# Patient Record
Sex: Male | Born: 1965
Health system: Southern US, Community
[De-identification: ages and names within clinical notes are randomized; demographics above are authoritative.]

## PROBLEM LIST (undated history)

## (undated) DIAGNOSIS — N400 Enlarged prostate without lower urinary tract symptoms: Secondary | ICD-10-CM

## (undated) DIAGNOSIS — G4730 Sleep apnea, unspecified: Secondary | ICD-10-CM

## (undated) DIAGNOSIS — Z87442 Personal history of urinary calculi: Secondary | ICD-10-CM

## (undated) HISTORY — PX: COLONOSCOPY: SHX174

---

## 2018-03-17 ENCOUNTER — Emergency Department (HOSPITAL_BASED_OUTPATIENT_CLINIC_OR_DEPARTMENT_OTHER)
Admission: EM | Admit: 2018-03-17 | Discharge: 2018-03-17 | Disposition: A | Discharge status: Home / Self Care | Payer: BLUE CROSS/BLUE SHIELD | Attending: Emergency Medicine | Admitting: Emergency Medicine

## 2018-03-17 ENCOUNTER — Encounter (HOSPITAL_BASED_OUTPATIENT_CLINIC_OR_DEPARTMENT_OTHER): Payer: Self-pay | Admitting: Adult Health

## 2018-03-17 ENCOUNTER — Other Ambulatory Visit: Payer: Self-pay

## 2018-03-17 DIAGNOSIS — R310 Gross hematuria: Secondary | ICD-10-CM | POA: Diagnosis not present

## 2018-03-17 DIAGNOSIS — R319 Hematuria, unspecified: Secondary | ICD-10-CM | POA: Diagnosis present

## 2018-03-17 HISTORY — DX: Benign prostatic hyperplasia without lower urinary tract symptoms: N40.0

## 2018-03-17 LAB — URINALYSIS, ROUTINE W REFLEX MICROSCOPIC
Bilirubin Urine: NEGATIVE
Glucose, UA: NEGATIVE mg/dL
Ketones, ur: NEGATIVE mg/dL
Leukocytes, UA: NEGATIVE
Nitrite: NEGATIVE
Protein, ur: NEGATIVE mg/dL
Specific Gravity, Urine: <1.005 — ABNORMAL LOW (ref 1.005–1.030)
pH: 6 (ref 5.0–8.0)

## 2018-03-17 LAB — BASIC METABOLIC PANEL
Anion gap: 11 (ref 5–15)
BUN: 18 mg/dL (ref 6–20)
CO2: 25 mmol/L (ref 22–32)
Calcium: 9.2 mg/dL (ref 8.9–10.3)
Chloride: 102 mmol/L (ref 98–111)
Creatinine, Ser: 1.07 mg/dL (ref 0.61–1.24)
GFR calc Af Amer: >60 mL/min (ref >60)
GFR calc non Af Amer: >60 mL/min (ref >60)
Glucose, Bld: 89 mg/dL (ref 70–99)
Potassium: 3.6 mmol/L (ref 3.5–5.1)
Sodium: 138 mmol/L (ref 135–145)

## 2018-03-17 LAB — CBC WITH DIFFERENTIAL/PLATELET
Basophils Absolute: 0 10*3/uL (ref 0.0–0.1)
Basophils Relative: 0 %
Eosinophils Absolute: 0.2 10*3/uL (ref 0.0–0.7)
Eosinophils Relative: 3 %
HCT: 44.5 % (ref 39.0–52.0)
Hemoglobin: 15.8 g/dL (ref 13.0–17.0)
Lymphocytes Relative: 24 %
Lymphs Abs: 1.7 10*3/uL (ref 0.7–4.0)
MCH: 31.3 pg (ref 26.0–34.0)
MCHC: 35.5 g/dL (ref 30.0–36.0)
MCV: 88.3 fL (ref 78.0–100.0)
Monocytes Absolute: 0.7 10*3/uL (ref 0.1–1.0)
Monocytes Relative: 9 %
Neutro Abs: 4.7 10*3/uL (ref 1.7–7.7)
Neutrophils Relative %: 64 %
Platelets: 188 10*3/uL (ref 150–400)
RBC: 5.04 MIL/uL (ref 4.22–5.81)
RDW: 12.5 % (ref 11.5–15.5)
WBC: 7.3 10*3/uL (ref 4.0–10.5)

## 2018-03-17 LAB — URINALYSIS, MICROSCOPIC (REFLEX): RBC / HPF: >50 RBC/hpf (ref 0–5)

## 2018-03-17 NOTE — ED Triage Notes (Signed)
Presents with 2 days of hematuria and abdominal discomfort. He denies fevers. HX of enlarged prostate and kidney stones. He denies flank pain and pain but reports his abdomen feels full.

## 2018-03-17 NOTE — ED Provider Notes (Signed)
Emergency Department Provider Note   I have reviewed the triage vital signs and the nursing notes.   HISTORY  Chief Complaint Hematuria   HPI Keith Wade is a 52 y.o. male with couple days of hematuria and abdominal discomfort.  Patient states that he is urinating with normal frequency now but the patient stated that a couple days ago something was going on and he did not want to urinate so he did not take his Flomax and he held it in throughout the day.  After this is when the hematuria started.  He was seen by urgent care who sent him here for further evaluation as his sample was not able to be read secondary to too much blood.  Patient states that time he urinated here seems like it cleared up.  He has no back pain.  No groin pain radiating to the back. No other associated or modifying symptoms.    Past Medical History:  Diagnosis Date  . Enlarged prostate     There are no active problems to display for this patient.   History reviewed. No pertinent surgical history.  Current Outpatient Rx  . Order #: [id number] Class: Historical Med    Allergies Patient has no known allergies.  History reviewed. No pertinent family history.  Social History Social History   Tobacco Use  . Smoking status: Never Smoker  . Smokeless tobacco: Never Used  Substance Use Topics  . Alcohol use: Yes  . Drug use: Never    Review of Systems  All other systems negative except as documented in the HPI. All pertinent positives and negatives as reviewed in the HPI. ____________________________________________   PHYSICAL EXAM:  VITAL SIGNS: ED Triage Vitals  Enc Vitals Group     BP 03/17/18 1743 115/66     Pulse Rate 03/17/18 1743 87     Resp 03/17/18 1743 18     Temp 03/17/18 1743 98.1 F (36.7 C)     Temp Source 03/17/18 1743 Oral     SpO2 03/17/18 1743 97 %     Weight 03/17/18 1744 220 lb (99.8 kg)     Height 03/17/18 1744 5\' 10"  (1.778 m)     Head Circumference --      Peak  Flow --      Pain Score 03/17/18 1744 0     Pain Loc --      Pain Edu? --      Excl. in [place]? --     Constitutional: Alert and oriented. Well appearing and in no acute distress. Eyes: Conjunctivae are normal. PERRL. EOMI. Head: Atraumatic. Nose: No congestion/rhinnorhea. Mouth/Throat: Mucous membranes are moist.  Oropharynx non-erythematous. Neck: No stridor.  No meningeal signs.   Cardiovascular: Normal rate, regular rhythm. Good peripheral circulation. Grossly normal heart sounds.   Respiratory: Normal respiratory effort.  No retractions. Lungs CTAB. Gastrointestinal: Soft and nontender. No distention.  Musculoskeletal: No lower extremity tenderness nor edema. No gross deformities of extremities. Neurologic:  Normal speech and language. No gross focal neurologic deficits are appreciated.  Skin:  Skin is warm, dry and intact. No rash noted.   ____________________________________________   LABS (all labs ordered are listed, but only abnormal results are displayed)  Labs Reviewed  URINALYSIS, ROUTINE W REFLEX MICROSCOPIC - Abnormal; Notable for the following components:      Result Value   Color, Urine PINK (*)    Specific Gravity, Urine <1.005 (*)    Hgb urine dipstick LARGE (*)    All other  components within normal limits  URINALYSIS, MICROSCOPIC (REFLEX) - Abnormal; Notable for the following components:   Bacteria, UA RARE (*)    All other components within normal limits  URINE CULTURE  CBC WITH DIFFERENTIAL/PLATELET  BASIC METABOLIC PANEL   ____________________________________________   INITIAL IMPRESSION / ASSESSMENT AND PLAN / ED COURSE  Patient states his symptoms are not consistent with previous history of kidney stones.  No significant evidence of infection at this time.  Symptoms seem to be improving and there is no evidence of kidney injury with it suggest a postobstructive nephropathy.  Patient will continue to monitor his urine if he continues to be grossly  hematuric he will follow-up with his doctor in a few days however if he continues to improve he will follow-up in a week or 2 to make sure there is no microscopic hematuria that needs to further work-up.     Pertinent labs & imaging results that were available during my care of the patient were reviewed by me and considered in my medical decision making (see chart for details).  ____________________________________________  FINAL CLINICAL IMPRESSION(S) / ED DIAGNOSES  Final diagnoses:  Gross hematuria     MEDICATIONS GIVEN DURING THIS VISIT:  Medications - No data to display   NEW OUTPATIENT MEDICATIONS STARTED DURING THIS VISIT:  Discharge Medication List as of 03/17/2018  8:09 PM      Note:  This note was prepared with assistance of Dragon voice recognition software. Occasional wrong-word or sound-a-like substitutions may have occurred due to the inherent limitations of voice recognition software.   Merrily Pew, MD 03/17/18 2144

## 2018-03-19 LAB — URINE CULTURE: Culture: NO GROWTH

## 2018-03-24 ENCOUNTER — Other Ambulatory Visit (HOSPITAL_BASED_OUTPATIENT_CLINIC_OR_DEPARTMENT_OTHER): Payer: Self-pay | Admitting: Family Medicine

## 2018-03-24 DIAGNOSIS — R319 Hematuria, unspecified: Secondary | ICD-10-CM

## 2018-05-11 ENCOUNTER — Other Ambulatory Visit: Payer: Self-pay | Admitting: Physician Assistant

## 2018-05-11 ENCOUNTER — Other Ambulatory Visit (HOSPITAL_BASED_OUTPATIENT_CLINIC_OR_DEPARTMENT_OTHER): Payer: Self-pay | Admitting: Physician Assistant

## 2018-05-11 DIAGNOSIS — R109 Unspecified abdominal pain: Secondary | ICD-10-CM

## 2018-05-11 DIAGNOSIS — R319 Hematuria, unspecified: Secondary | ICD-10-CM

## 2020-01-11 ENCOUNTER — Ambulatory Visit: Payer: Self-pay | Admitting: General Surgery

## 2020-01-17 ENCOUNTER — Encounter (HOSPITAL_BASED_OUTPATIENT_CLINIC_OR_DEPARTMENT_OTHER): Payer: Self-pay | Admitting: General Surgery

## 2020-01-17 ENCOUNTER — Other Ambulatory Visit: Payer: Self-pay

## 2020-01-21 ENCOUNTER — Other Ambulatory Visit (HOSPITAL_COMMUNITY): Payer: Self-pay

## 2020-01-22 ENCOUNTER — Other Ambulatory Visit (HOSPITAL_COMMUNITY)
Admission: RE | Admit: 2020-01-22 | Discharge: 2020-01-22 | Disposition: A | Discharge status: Home / Self Care | Payer: 59 | Source: Ambulatory Visit | Attending: General Surgery | Admitting: General Surgery

## 2020-01-22 DIAGNOSIS — Z20822 Contact with and (suspected) exposure to covid-19: Secondary | ICD-10-CM | POA: Diagnosis not present

## 2020-01-22 DIAGNOSIS — Z01812 Encounter for preprocedural laboratory examination: Secondary | ICD-10-CM | POA: Insufficient documentation

## 2020-01-22 LAB — SARS CORONAVIRUS 2 (TAT 6-24 HRS): SARS Coronavirus 2: NEGATIVE

## 2020-01-22 NOTE — Progress Notes (Signed)

## 2020-01-24 ENCOUNTER — Ambulatory Visit (HOSPITAL_BASED_OUTPATIENT_CLINIC_OR_DEPARTMENT_OTHER): Payer: 59 | Admitting: Anesthesiology

## 2020-01-24 ENCOUNTER — Ambulatory Visit (HOSPITAL_BASED_OUTPATIENT_CLINIC_OR_DEPARTMENT_OTHER)
Admission: RE | Admit: 2020-01-24 | Discharge: 2020-01-24 | Disposition: A | Discharge status: Home / Self Care | Payer: 59 | Attending: General Surgery | Admitting: General Surgery

## 2020-01-24 ENCOUNTER — Other Ambulatory Visit: Payer: Self-pay

## 2020-01-24 ENCOUNTER — Encounter (HOSPITAL_BASED_OUTPATIENT_CLINIC_OR_DEPARTMENT_OTHER)
Admission: RE | Disposition: A | Discharge status: Home / Self Care | Payer: Self-pay | Source: Home / Self Care | Attending: General Surgery

## 2020-01-24 ENCOUNTER — Encounter (HOSPITAL_BASED_OUTPATIENT_CLINIC_OR_DEPARTMENT_OTHER): Payer: Self-pay | Admitting: General Surgery

## 2020-01-24 DIAGNOSIS — G4730 Sleep apnea, unspecified: Secondary | ICD-10-CM | POA: Insufficient documentation

## 2020-01-24 DIAGNOSIS — K4090 Unilateral inguinal hernia, without obstruction or gangrene, not specified as recurrent: Secondary | ICD-10-CM | POA: Insufficient documentation

## 2020-01-24 HISTORY — PX: INGUINAL HERNIA REPAIR: SHX194

## 2020-01-24 HISTORY — DX: Sleep apnea, unspecified: G47.30

## 2020-01-24 HISTORY — DX: Personal history of urinary calculi: Z87.442

## 2020-01-24 SURGERY — REPAIR, HERNIA, INGUINAL, ADULT
Anesthesia: Regional | Site: Groin | Laterality: Left

## 2020-01-24 MED ORDER — CELECOXIB 200 MG PO CAPS
ORAL_CAPSULE | ORAL | Status: AC
  Filled 2020-01-24: qty 1

## 2020-01-24 MED ORDER — ROCURONIUM BROMIDE 100 MG/10ML IV SOLN
INTRAVENOUS | Status: DC | PRN
  Administered 2020-01-24: 60 mg via INTRAVENOUS

## 2020-01-24 MED ORDER — ACETAMINOPHEN 160 MG/5ML PO SOLN: 1000.0000 mg | Freq: Once | ORAL | Status: DC | PRN

## 2020-01-24 MED ORDER — DEXAMETHASONE SODIUM PHOSPHATE 4 MG/ML IJ SOLN
INTRAMUSCULAR | Status: DC | PRN
  Administered 2020-01-24: 10 mg via INTRAVENOUS

## 2020-01-24 MED ORDER — LIDOCAINE HCL (CARDIAC) PF 100 MG/5ML IV SOSY
PREFILLED_SYRINGE | INTRAVENOUS | Status: DC | PRN
  Administered 2020-01-24: 50 mg via INTRAVENOUS

## 2020-01-24 MED ORDER — ACETAMINOPHEN 10 MG/ML IV SOLN: 1000.0000 mg | Freq: Once | INTRAVENOUS | Status: DC | PRN

## 2020-01-24 MED ORDER — HYDROCODONE-ACETAMINOPHEN 5-325 MG PO TABS: 1.0000 | ORAL_TABLET | Freq: Four times a day (QID) | ORAL | 0 refills | Status: DC | PRN

## 2020-01-24 MED ORDER — ACETAMINOPHEN 500 MG PO TABS
ORAL_TABLET | ORAL | Status: AC
  Filled 2020-01-24: qty 2

## 2020-01-24 MED ORDER — FENTANYL CITRATE (PF) 100 MCG/2ML IJ SOLN
INTRAMUSCULAR | Status: AC
  Filled 2020-01-24: qty 2

## 2020-01-24 MED ORDER — GABAPENTIN 300 MG PO CAPS
ORAL_CAPSULE | ORAL | Status: AC
  Filled 2020-01-24: qty 1

## 2020-01-24 MED ORDER — MIDAZOLAM HCL 5 MG/5ML IJ SOLN
INTRAMUSCULAR | Status: DC | PRN
  Administered 2020-01-24: 2 mg via INTRAVENOUS

## 2020-01-24 MED ORDER — SUGAMMADEX SODIUM 200 MG/2ML IV SOLN
INTRAVENOUS | Status: DC | PRN
Start: 2020-01-24 — End: 2020-01-24
  Administered 2020-01-24: 200 mg via INTRAVENOUS

## 2020-01-24 MED ORDER — ACETAMINOPHEN 500 MG PO TABS: 1000.0000 mg | ORAL_TABLET | Freq: Once | ORAL | Status: DC | PRN

## 2020-01-24 MED ORDER — CEFAZOLIN SODIUM-DEXTROSE 2-4 GM/100ML-% IV SOLN
INTRAVENOUS | Status: AC
  Filled 2020-01-24: qty 100

## 2020-01-24 MED ORDER — OXYCODONE HCL 5 MG PO TABS
5.0000 mg | ORAL_TABLET | Freq: Once | ORAL | Status: AC | PRN
  Administered 2020-01-24: 5 mg via ORAL

## 2020-01-24 MED ORDER — 0.9 % SODIUM CHLORIDE (POUR BTL) OPTIME
TOPICAL | Status: DC | PRN
  Administered 2020-01-24: 120 mL

## 2020-01-24 MED ORDER — GABAPENTIN 300 MG PO CAPS
300.0000 mg | ORAL_CAPSULE | ORAL | Status: AC
  Administered 2020-01-24: 300 mg via ORAL

## 2020-01-24 MED ORDER — ONDANSETRON HCL 4 MG/2ML IJ SOLN
INTRAMUSCULAR | Status: DC | PRN
  Administered 2020-01-24: 4 mg via INTRAVENOUS

## 2020-01-24 MED ORDER — MIDAZOLAM HCL 2 MG/2ML IJ SOLN
INTRAMUSCULAR | Status: AC
  Filled 2020-01-24: qty 2

## 2020-01-24 MED ORDER — OXYCODONE HCL 5 MG PO TABS
ORAL_TABLET | ORAL | Status: AC
  Filled 2020-01-24: qty 1

## 2020-01-24 MED ORDER — BUPIVACAINE HCL (PF) 0.25 % IJ SOLN
INTRAMUSCULAR | Status: DC | PRN
  Administered 2020-01-24: 18 mL

## 2020-01-24 MED ORDER — CHLORHEXIDINE GLUCONATE CLOTH 2 % EX PADS: 6.0000 | MEDICATED_PAD | Freq: Once | CUTANEOUS | Status: DC

## 2020-01-24 MED ORDER — FENTANYL CITRATE (PF) 100 MCG/2ML IJ SOLN
INTRAMUSCULAR | Status: DC | PRN
  Administered 2020-01-24: 100 ug via INTRAVENOUS

## 2020-01-24 MED ORDER — BUPIVACAINE HCL (PF) 0.25 % IJ SOLN
INTRAMUSCULAR | Status: AC
  Filled 2020-01-24: qty 30

## 2020-01-24 MED ORDER — OXYCODONE HCL 5 MG/5ML PO SOLN: 5.0000 mg | Freq: Once | ORAL | Status: AC | PRN

## 2020-01-24 MED ORDER — DIPHENHYDRAMINE HCL 50 MG/ML IJ SOLN
INTRAMUSCULAR | Status: DC | PRN
  Administered 2020-01-24: 6.2 mg via INTRAVENOUS

## 2020-01-24 MED ORDER — FENTANYL CITRATE (PF) 100 MCG/2ML IJ SOLN: 25.0000 ug | INTRAMUSCULAR | Status: DC | PRN

## 2020-01-24 MED ORDER — PROPOFOL 10 MG/ML IV BOLUS
INTRAVENOUS | Status: DC | PRN
  Administered 2020-01-24: 200 mg via INTRAVENOUS

## 2020-01-24 MED ORDER — LACTATED RINGERS IV SOLN: INTRAVENOUS | Status: DC

## 2020-01-24 MED ORDER — BUPIVACAINE-EPINEPHRINE (PF) 0.5% -1:200000 IJ SOLN
INTRAMUSCULAR | Status: DC | PRN
Start: 2020-01-24 — End: 2020-01-24
  Administered 2020-01-24: 30 mL via PERINEURAL

## 2020-01-24 MED ORDER — CEFAZOLIN SODIUM-DEXTROSE 2-4 GM/100ML-% IV SOLN
2.0000 g | INTRAVENOUS | Status: AC
  Administered 2020-01-24: 2 g via INTRAVENOUS

## 2020-01-24 MED ORDER — ACETAMINOPHEN 500 MG PO TABS: 1000.0000 mg | ORAL_TABLET | ORAL | Status: DC

## 2020-01-24 MED ORDER — CELECOXIB 200 MG PO CAPS
200.0000 mg | ORAL_CAPSULE | ORAL | Status: AC
  Administered 2020-01-24: 200 mg via ORAL

## 2020-01-24 SURGICAL SUPPLY — 46 items
ADH SKN CLS APL DERMABOND .7 (GAUZE/BANDAGES/DRESSINGS) ×1
APL PRP STRL LF DISP 70% ISPRP (MISCELLANEOUS) ×1
APL SKNCLS STERI-STRIP NONHPOA (GAUZE/BANDAGES/DRESSINGS)
BENZOIN TINCTURE PRP APPL 2/3 (GAUZE/BANDAGES/DRESSINGS) IMPLANT
BLADE CLIPPER SURG (BLADE) IMPLANT
BLADE HEX COATED 2.75 (ELECTRODE) ×2 IMPLANT
BLADE SURG 15 STRL LF DISP TIS (BLADE) ×1 IMPLANT
BLADE SURG 15 STRL SS (BLADE) ×2
CHLORAPREP W/TINT 26 (MISCELLANEOUS) ×2 IMPLANT
COVER BACK TABLE 60X90IN (DRAPES) ×2 IMPLANT
COVER MAYO STAND STRL (DRAPES) ×2 IMPLANT
COVER WAND RF STERILE (DRAPES) IMPLANT
DECANTER SPIKE VIAL GLASS SM (MISCELLANEOUS) IMPLANT
DERMABOND ADVANCED (GAUZE/BANDAGES/DRESSINGS) ×1
DERMABOND ADVANCED .7 DNX12 (GAUZE/BANDAGES/DRESSINGS) ×1 IMPLANT
DRAIN PENROSE 1/2X12 LTX STRL (WOUND CARE) ×2 IMPLANT
DRAPE LAPAROTOMY TRNSV 102X78 (DRAPES) ×2 IMPLANT
DRAPE UTILITY XL STRL (DRAPES) ×2 IMPLANT
ELECT REM PT RETURN 9FT ADLT (ELECTROSURGICAL) ×2
ELECTRODE REM PT RTRN 9FT ADLT (ELECTROSURGICAL) ×1 IMPLANT
GLOVE BIO SURGEON STRL SZ7.5 (GLOVE) ×2 IMPLANT
GOWN STRL REUS W/ TWL LRG LVL3 (GOWN DISPOSABLE) ×2 IMPLANT
GOWN STRL REUS W/TWL LRG LVL3 (GOWN DISPOSABLE) ×4
MESH ULTRAPRO 3X6 7.6X15CM (Mesh General) ×2 IMPLANT
NEEDLE HYPO 25X1 1.5 SAFETY (NEEDLE) ×2 IMPLANT
NS IRRIG 1000ML POUR BTL (IV SOLUTION) ×2 IMPLANT
PACK BASIN DAY SURGERY FS (CUSTOM PROCEDURE TRAY) ×2 IMPLANT
PENCIL SMOKE EVACUATOR (MISCELLANEOUS) ×2 IMPLANT
SLEEVE SCD COMPRESS KNEE MED (MISCELLANEOUS) ×2 IMPLANT
SPONGE LAP 18X18 RF (DISPOSABLE) ×2 IMPLANT
STRIP CLOSURE SKIN 1/2X4 (GAUZE/BANDAGES/DRESSINGS) IMPLANT
SUT MON AB 4-0 PC3 18 (SUTURE) ×2 IMPLANT
SUT PROLENE 2 0 SH DA (SUTURE) ×4 IMPLANT
SUT SILK 2 0 SH (SUTURE) IMPLANT
SUT SILK 3 0 SH 30 (SUTURE) IMPLANT
SUT SILK 3 0 TIES 17X18 (SUTURE) ×2
SUT SILK 3-0 18XBRD TIE BLK (SUTURE) ×1 IMPLANT
SUT VIC AB 0 CT1 27 (SUTURE) ×2
SUT VIC AB 0 CT1 27XBRD ANBCTR (SUTURE) ×1 IMPLANT
SUT VIC AB 0 SH 27 (SUTURE) IMPLANT
SUT VIC AB 2-0 SH 27 (SUTURE) ×2
SUT VIC AB 2-0 SH 27XBRD (SUTURE) ×1 IMPLANT
SUT VIC AB 3-0 SH 27 (SUTURE) ×2
SUT VIC AB 3-0 SH 27X BRD (SUTURE) ×1 IMPLANT
SYR CONTROL 10ML LL (SYRINGE) ×2 IMPLANT
TOWEL GREEN STERILE FF (TOWEL DISPOSABLE) ×4 IMPLANT

## 2020-01-24 NOTE — Discharge Instructions (Signed)

## 2020-01-24 NOTE — Anesthesia Procedure Notes (Signed)
Anesthesia Regional Block: TAP block   Pre-Anesthetic Checklist: ,, timeout performed, Correct Patient, Correct Site, Correct Laterality, Correct Procedure, Correct Position, site marked, Risks and benefits discussed,  Surgical consent,  Pre-op evaluation,  At surgeon's request and post-op pain management  Laterality: Left  Prep: chloraprep       Needles:  Injection technique: Single-shot     Needle Length: 9cm  Needle Gauge: 22     Additional Needles: Arrow StimuQuik ECHO Echogenic Stimulating PNB Needle  Procedures:,,,, ultrasound used (permanent image in chart),,,,  Narrative:  Start time: 01/24/2020 4:04 PM End time: 01/24/2020 4:11 PM Injection made incrementally with aspirations every 5 mL.  Performed by: Personally  Anesthesiologist: Oleta Mouse, MD

## 2020-01-24 NOTE — H&P (Signed)
Keith Wade  Location: [facility] Patient #: [id number] DOB: [birth year] Separated / Language: Keith Wade / Race: Undefined Male   History of Present Illness  The patient is a 54 year old male who presents with an inguinal hernia. we are asked to see the patient in consultation by Dr. Carolee Rota to evaluate him for a left inguinal hernia. The patient is a 54 year old male who presents with pain and a bulge in the left groin that began about 4 weeks ago. He recalls moving a large branch from his yard at the time that he developed the pain. He denies any nausea or vomiting associated with it. He is having some progressive discomfort. He does not smoke. He is otherwise in pretty good health.   Past Surgical History  No pertinent past surgical history   Allergies No Known Drug Allergies   Medication History Flomax (0.4MG  Capsule, Oral) Active. Allergy 24-HR (Oral) Specific strength unknown - Active. Advil (200MG  Tablet, Oral) Active. Medications Reconciled  Social History  Alcohol use  Occasional alcohol use. Caffeine use  Carbonated beverages, Coffee, Tea. No drug use  Tobacco use  Never smoker.  Family History  First Degree Relatives  No pertinent family history   Other Problems No pertinent past medical history     Review of Systems  General Not Present- Appetite Loss, Chills, Fatigue, Fever, Night Sweats, Weight Gain and Weight Loss. Skin Not Present- Change in Wart/Mole, Dryness, Hives, Jaundice, New Lesions, Non-Healing Wounds, Rash and Ulcer. HEENT Not Present- Earache, Hearing Loss, Hoarseness, Nose Bleed, Oral Ulcers, Ringing in the Ears, Seasonal Allergies, Sinus Pain, Sore Throat, Visual Disturbances, Wears glasses/contact lenses and Yellow Eyes. Respiratory Present- Snoring. Not Present- Bloody sputum, Chronic Cough, Difficulty Breathing and Wheezing. Breast Not Present- Breast Mass, Breast Pain, Nipple Discharge and Skin  Changes. Cardiovascular Not Present- Chest Pain, Difficulty Breathing Lying Down, Leg Cramps, Palpitations, Rapid Heart Rate, Shortness of Breath and Swelling of Extremities. Gastrointestinal Not Present- Abdominal Pain, Bloating, Bloody Stool, Change in Bowel Habits, Chronic diarrhea, Constipation, Difficulty Swallowing, Excessive gas, Gets full quickly at meals, Hemorrhoids, Indigestion, Nausea, Rectal Pain and Vomiting. Male Genitourinary Present- Frequency. Not Present- Blood in Urine, Change in Urinary Stream, Impotence, Nocturia, Painful Urination, Urgency and Urine Leakage. Musculoskeletal Not Present- Back Pain, Joint Pain, Joint Stiffness, Muscle Pain, Muscle Weakness and Swelling of Extremities. Neurological Present- Headaches. Not Present- Decreased Memory, Fainting, Numbness, Seizures, Tingling, Tremor, Trouble walking and Weakness. Psychiatric Present- Depression. Not Present- Anxiety, Bipolar, Change in Sleep Pattern, Fearful and Frequent crying. Endocrine Not Present- Cold Intolerance, Excessive Hunger, Hair Changes, Heat Intolerance, Hot flashes and New Diabetes. Hematology Not Present- Blood Thinners, Easy Bruising, Excessive bleeding, Gland problems, HIV and Persistent Infections.  Vitals Weight: 204.25 lb Height: 69in Body Surface Area: 2.08 m Body Mass Index: 30.16 kg/m  Pulse: 61 (Regular)        Physical Exam  General Mental Status-Alert. General Appearance-Consistent with stated age. Hydration-Well hydrated. Voice-Normal.  Head and Neck Head-normocephalic, atraumatic with no lesions or palpable masses. Trachea-midline. Thyroid Gland Characteristics - normal size and consistency.  Eye Eyeball - Bilateral-Extraocular movements intact. Sclera/Conjunctiva - Bilateral-No scleral icterus.  Chest and Lung Exam Chest and lung exam reveals -quiet, even and easy respiratory effort with no use of accessory muscles and on auscultation,  normal breath sounds, no adventitious sounds and normal vocal resonance. Inspection Chest Wall - Normal. Back - normal.  Cardiovascular Cardiovascular examination reveals -normal heart sounds, regular rate and rhythm with no murmurs and normal pedal  pulses bilaterally.  Abdomen Inspection Inspection of the abdomen reveals - No Hernias. Skin - Scar - no surgical scars. Palpation/Percussion Palpation and Percussion of the abdomen reveal - Soft, Non Tender, No Rebound tenderness, No Rigidity (guarding) and No hepatosplenomegaly. Auscultation Auscultation of the abdomen reveals - Bowel sounds normal.  Male Genitourinary Note: there is a small to medium size bulge in the left groin that reduces easily. There is no palpable bulge or impulse with straining in the right groin. He has normal-appearing external genitalia   Neurologic Neurologic evaluation reveals -alert and oriented x 3 with no impairment of recent or remote memory. Mental Status-Normal.  Musculoskeletal Normal Exam - Left-Upper Extremity Strength Normal and Lower Extremity Strength Normal. Normal Exam - Right-Upper Extremity Strength Normal and Lower Extremity Strength Normal.  Lymphatic Head & Neck  General Head & Neck Lymphatics: Bilateral - Description - Normal. Axillary  General Axillary Region: Bilateral - Description - Normal. Tenderness - Non Tender. Femoral & Inguinal  Generalized Femoral & Inguinal Lymphatics: Bilateral - Description - Normal. Tenderness - Non Tender.    Assessment & Plan  INGUINAL HERNIA OF LEFT SIDE WITHOUT OBSTRUCTION OR GANGRENE (K40.90) Impression: the patient appears to have a small but symptomatic left inguinal hernia. Because of the risk of incarceration and strangulation and I could benefit from having this fixed. He would also like to have this done. I have discussed with him in detail the risks and benefits of the operation to fix the hernia as well as some of the  technical aspects including the use of mesh and the risk of chronic pain and he understands and wishes to proceed. This patient encounter took 45 minutes today to perform the following: take history, perform exam, review outside records, interpret imaging, counsel the patient on their diagnosis and document encounter, findings & plan in the EHR

## 2020-01-24 NOTE — Anesthesia Procedure Notes (Signed)
Procedure Name: Intubation Date/Time: 01/24/2020 4:09 PM Performed by: Willa Frater, CRNA Pre-anesthesia Checklist: Patient identified, Emergency Drugs available, Suction available and Patient being monitored Patient Re-evaluated:Patient Re-evaluated prior to induction Oxygen Delivery Method: Circle system utilized Preoxygenation: Pre-oxygenation with 100% oxygen Induction Type: IV induction Ventilation: Mask ventilation without difficulty Grade View: Grade II Tube type: Oral Number of attempts: 1 Airway Equipment and Method: Stylet and Oral airway Placement Confirmation: ETT inserted through vocal cords under direct vision,  positive ETCO2 and breath sounds checked- equal and bilateral Secured at: 23 cm Tube secured with: Tape Dental Injury: Teeth and Oropharynx as per pre-operative assessment

## 2020-01-24 NOTE — Transfer of Care (Signed)
Immediate Anesthesia Transfer of Care Note  Patient: Keith Wade  Procedure(s) Performed: LEFT INGUINAL HERNIA REPAIR WITH MESH (Left Groin)  Patient Location: PACU  Anesthesia Type:GA combined with regional for post-op pain  Level of Consciousness: awake, alert , oriented and drowsy  Airway & Oxygen Therapy: Patient Spontanous Breathing and Patient connected to face mask oxygen  Post-op Assessment: Report given to RN and Post -op Vital signs reviewed and stable  Post vital signs: Reviewed and stable  Last Vitals:  Vitals Value Taken Time  BP    Temp    Pulse    Resp    SpO2      Last Pain:  Vitals:   01/24/20 1412  TempSrc: Oral  PainSc: 0-No pain      Patients Stated Pain Goal: 3 (27/80/04 4715)  Complications: No complications documented.

## 2020-01-24 NOTE — Op Note (Signed)
01/24/2020  5:26 PM  PATIENT:  Keith Wade  54 y.o. male  PRE-OPERATIVE DIAGNOSIS:  LEFT INGUINAL HERNIA  POST-OPERATIVE DIAGNOSIS:  LEFT INGUINAL HERNIA  PROCEDURE:  Procedure(s): LEFT INGUINAL HERNIA REPAIR WITH MESH (Left)  SURGEON:  Surgeon(s) and Role:    * Jovita Kussmaul, MD - Primary  PHYSICIAN ASSISTANT:   ASSISTANTS: none   ANESTHESIA:   local and general  EBL:  minimal   BLOOD ADMINISTERED:none  DRAINS: none   LOCAL MEDICATIONS USED:  MARCAINE     SPECIMEN:  No Specimen  DISPOSITION OF SPECIMEN:  N/A  COUNTS:  YES  TOURNIQUET:  * No tourniquets in log *  DICTATION: .Dragon Dictation   After informed consent was obtained the patient was brought to the operating room and placed in the supine position on the operating table.  After adequate induction of general anesthesia the patient's abdomen and left groin were prepped with ChloraPrep, allowed to dry, and draped in usual sterile manner.  An appropriate timeout was performed.  The left groin was then infiltrated with quarter percent Marcaine.  A small incision was made from the edge of the pubic tubercle on the left towards the anterior superior iliac spine.  The incision was carried through the skin and subcutaneous tissue sharply with the electrocautery until the fascia of the external oblique was encountered.  A small bridging vein was clamped with hemostats, divided, and ligated with 3-0 silk ties.  The external oblique fascia was then opened along its fibers towards the apex of the external ring with a 15 blade knife and Metzenbaum scissors.  A [place] retractor was deployed.  Blunt dissection was carried out of the cord structures until they could be surrounded between 2 fingers.  Half inch Penrose drain was placed around the cord structures for retraction purposes.  The cord was then gently skeletonized by blunt finger dissection.  There was a very thick muscular layer and what appeared to be a rather large  lipoma of the cord with a small sac associated with it.  Because of the large thick nature of the fat associated with the small sac this was all reduced back beneath the transversalis muscle without difficulty.  The floor of the canal was then repaired with interrupted 0 Vicryl stitches.  During the dissection the ilioinguinal nerve was identified and also involved with scar tissue.  It was dissected out proximally and distally, clamped, divided, and ligated with 3-0 silk ties.  Next a 3 x 6 piece of ultra Pro mesh was chosen and cut to the appropriate size.  The mesh was sewed inferiorly to the shelving edge of the inguinal ligament with a running 2-0 Prolene stitch.  Tails were cut in the mesh laterally and the tails were wrapped around the cord structures.  Superiorly the mesh was sewed to the musculoaponeurotic strength layer of the transversalis with interrupted 2-0 Prolene vertical mattress stitches.  The tails of the mesh were anchored to the shelving edge of the inguinal ligament lateral to the cord with interrupted 2-0 Prolene stitches.  Once this was accomplished the hernia seem well repaired and the mesh was in good position.  The wound was irrigated with copious amounts of saline.  The external oblique fascia was then reapproximated with a running 2-0 Vicryl stitch.  The wound was infiltrated with more quarter percent Marcaine.  The subcutaneous fascia was then closed with a running 3-0 Vicryl stitch.  The skin was then closed with a running 4-0 Monocryl  subcuticular stitch.  Dermabond dressings were applied.  The patient tolerated the procedure well.  At the end of the case all needle sponge and instrument counts were correct.  The patient was then awakened and taken to recovery in stable condition.  The patient's testicle was in the scrotum at the end of the case.  PLAN OF CARE: Discharge to home after PACU  PATIENT DISPOSITION:  PACU - hemodynamically stable.   Delay start of Pharmacological VTE  agent (>24hrs) due to surgical blood loss or risk of bleeding: not applicable

## 2020-01-24 NOTE — Interval H&P Note (Signed)
History and Physical Interval Note:  01/24/2020 3:28 PM  Keith Wade  has presented today for surgery, with the diagnosis of LET INGUINAL HERNIA.  The various methods of treatment have been discussed with the patient and family. After consideration of risks, benefits and other options for treatment, the patient has consented to  Procedure(s) with comments: LEFT INGUINAL HERNIA REPAIR WITH MESH (Left) - TAP BLOCK as a surgical intervention.  The patient's history has been reviewed, patient examined, no change in status, stable for surgery.  I have reviewed the patient's chart and labs.  Questions were answered to the patient's satisfaction.     Autumn Messing III

## 2020-01-24 NOTE — Anesthesia Preprocedure Evaluation (Signed)
Anesthesia Evaluation  Patient identified by MRN, date of birth, ID band Patient awake    Reviewed: Allergy & Precautions, NPO status , Patient's Chart, lab work & pertinent test results  Airway Mallampati: II  TM Distance: >3 FB Neck ROM: Full    Dental  (+) Dental Advisory Given, Teeth Intact   Pulmonary sleep apnea and Continuous Positive Airway Pressure Ventilation , neg recent URI,    breath sounds clear to auscultation       Cardiovascular negative cardio ROS   Rhythm:Regular     Neuro/Psych negative neurological ROS  negative psych ROS   GI/Hepatic negative GI ROS, Neg liver ROS,   Endo/Other  negative endocrine ROS  Renal/GU negative Renal ROS     Musculoskeletal negative musculoskeletal ROS (+)   Abdominal   Peds  Hematology negative hematology ROS (+)   Anesthesia Other Findings   Reproductive/Obstetrics                             Anesthesia Physical Anesthesia Plan  ASA: II  Anesthesia Plan: General and Regional   Post-op Pain Management:  Regional for Post-op pain   Induction: Intravenous  PONV Risk Score and Plan: 2 and Ondansetron and Dexamethasone  Airway Management Planned: LMA and Oral ETT  Additional Equipment: None  Intra-op Plan:   Post-operative Plan: Extubation in OR  Informed Consent: I have reviewed the patients History and Physical, chart, labs and discussed the procedure including the risks, benefits and alternatives for the proposed anesthesia with the patient or authorized representative who has indicated his/her understanding and acceptance.     Dental advisory given  Plan Discussed with: CRNA and Surgeon  Anesthesia Plan Comments:         Anesthesia Quick Evaluation

## 2020-01-25 ENCOUNTER — Encounter (HOSPITAL_BASED_OUTPATIENT_CLINIC_OR_DEPARTMENT_OTHER): Payer: Self-pay | Admitting: General Surgery

## 2020-02-03 NOTE — Anesthesia Postprocedure Evaluation (Signed)
Anesthesia Post Note  Patient: Karron Alvizo  Procedure(s) Performed: LEFT INGUINAL HERNIA REPAIR WITH MESH (Left Groin)     Patient location during evaluation: PACU Anesthesia Type: Regional and General Level of consciousness: awake and alert Pain management: pain level controlled Vital Signs Assessment: post-procedure vital signs reviewed and stable Respiratory status: spontaneous breathing, nonlabored ventilation, respiratory function stable and patient connected to nasal cannula oxygen Cardiovascular status: blood pressure returned to baseline and stable Postop Assessment: no apparent nausea or vomiting Anesthetic complications: no   No complications documented.  Last Vitals:  Vitals:   01/24/20 1800 01/24/20 1815  BP: 109/72 (!) 121/88  Pulse: 71 73  Resp: (!) 10 16  Temp:  36.6 C  SpO2: 99% 100%    Last Pain:  Vitals:   01/25/20 1021  TempSrc:   PainSc: 0-No pain                 Kayle Correa

## 2020-02-12 ENCOUNTER — Other Ambulatory Visit: Payer: Self-pay | Admitting: General Surgery

## 2020-02-12 DIAGNOSIS — R1011 Right upper quadrant pain: Secondary | ICD-10-CM

## 2020-02-19 ENCOUNTER — Ambulatory Visit
Admission: RE | Admit: 2020-02-19 | Discharge: 2020-02-19 | Disposition: A | Discharge status: Home / Self Care | Payer: 59 | Source: Ambulatory Visit | Attending: General Surgery | Admitting: General Surgery

## 2020-02-19 DIAGNOSIS — R1011 Right upper quadrant pain: Secondary | ICD-10-CM

## 2020-07-21 ENCOUNTER — Other Ambulatory Visit: Payer: Self-pay | Admitting: Physician Assistant

## 2020-07-21 DIAGNOSIS — R1031 Right lower quadrant pain: Secondary | ICD-10-CM

## 2020-08-05 ENCOUNTER — Ambulatory Visit
Admission: RE | Admit: 2020-08-05 | Discharge: 2020-08-05 | Disposition: A | Discharge status: Home / Self Care | Payer: 59 | Source: Ambulatory Visit | Attending: Physician Assistant | Admitting: Physician Assistant

## 2020-08-05 ENCOUNTER — Other Ambulatory Visit: Payer: Self-pay

## 2020-08-05 DIAGNOSIS — R1031 Right lower quadrant pain: Secondary | ICD-10-CM

## 2020-08-05 MED ORDER — IOPAMIDOL (ISOVUE-300) INJECTION 61%
100.0000 mL | Freq: Once | INTRAVENOUS | Status: AC | PRN
  Administered 2020-08-05: 100 mL via INTRAVENOUS

## 2020-09-05 ENCOUNTER — Ambulatory Visit: Payer: Self-pay | Admitting: General Surgery

## 2020-09-12 ENCOUNTER — Other Ambulatory Visit: Payer: Self-pay | Admitting: Urology

## 2020-10-08 NOTE — Patient Instructions (Addendum)
DUE TO COVID-19 ONLY ONE VISITOR IS ALLOWED TO COME WITH YOU AND STAY IN THE WAITING ROOM ONLY DURING PRE OP AND PROCEDURE DAY OF SURGERY. THE 1 VISITOR  MAY VISIT WITH YOU AFTER SURGERY IN YOUR PRIVATE ROOM DURING VISITING HOURS ONLY!  YOU NEED TO HAVE A COVID 19 TEST ON: 10/16/20  @ 11:00 AM, THIS TEST MUST BE DONE BEFORE SURGERY,  COVID TESTING SITE [place] [address], IT IS ON THE RIGHT GOING OUT [address] APPROXIMATELY  2 MINUTES PAST [employer] ON THE RIGHT. ONCE YOUR COVID TEST IS COMPLETED,  PLEASE BEGIN THE QUARANTINE INSTRUCTIONS AS OUTLINED IN YOUR HANDOUT.                Keith Wade   Your procedure is scheduled on: 10/20/20   Report to [facility] Main  Entrance   Report to admitting at: 11:00 AM     Call this number if you have problems the morning of surgery [phone]    Remember: Do not eat solid food :After Midnight. Clear liquids until: 10:00 am.  CLEAR LIQUID DIET  Foods Allowed                                                                     Foods Excluded  Coffee and tea, regular and decaf                             liquids that you cannot  Plain Jell-O any favor except red or purple                                           see through such as: Fruit ices (not with fruit pulp)                                     milk, soups, orange juice  Iced Popsicles                                    All solid food Carbonated beverages, regular and diet                                    Cranberry, grape and apple juices Sports drinks like Gatorade Lightly seasoned clear broth or consume(fat free) Sugar, honey syrup  Sample Menu Breakfast                                Lunch                                     Supper Cranberry juice  Beef broth                            Chicken broth Jell-O                                     Grape juice                           Apple juice Coffee or tea                         Jell-O                                      Popsicle                                                Coffee or tea                        Coffee or tea  _____________________________________________________________________  BRUSH YOUR TEETH MORNING OF SURGERY AND RINSE YOUR MOUTH OUT, NO CHEWING GUM CANDY OR MINTS.    Take these medicines the morning of surgery with A SIP OF WATER: alfuzosin.flonase as usual.Clonazepam as needed.                               You may not have any metal on your body including hair pins and              piercings  Do not wear jewelry, lotions, powders or perfumes, deodorant             Men may shave face and neck.   Do not bring valuables to the hospital. [place].  Contacts, dentures or bridgework may not be worn into surgery.  Leave suitcase in the car. After surgery it may be brought to your room.     Patients discharged the day of surgery will not be allowed to drive home. IF YOU ARE HAVING SURGERY AND GOING HOME THE SAME DAY, YOU MUST HAVE AN ADULT TO DRIVE YOU HOME AND BE WITH YOU FOR 24 HOURS. YOU MAY GO HOME BY TAXI OR UBER OR ORTHERWISE, BUT AN ADULT MUST ACCOMPANY YOU HOME AND STAY WITH YOU FOR 24 HOURS.  Name and phone number of your driver:  Special Instructions: N/A              Please read over the following fact sheets you were given: _____________________________________________________________________         [facility] - Preparing for Surgery Before surgery, you can play an important role.  Because skin is not sterile, your skin needs to be as free of germs as possible.  You can reduce the number of germs on your skin by washing with CHG (chlorahexidine gluconate) soap before surgery.  CHG is an antiseptic cleaner which kills germs and bonds with the skin to continue  killing germs even after washing. Please DO NOT use if you have an allergy to CHG or antibacterial soaps.  If  your skin becomes reddened/irritated stop using the CHG and inform your nurse when you arrive at Short Stay. Do not shave (including legs and underarms) for at least 48 hours prior to the first CHG shower.  You may shave your face/neck. Please follow these instructions carefully:  1.  Shower with CHG Soap the night before surgery and the  morning of Surgery.  2.  If you choose to wash your hair, wash your hair first as usual with your  normal  shampoo.  3.  After you shampoo, rinse your hair and body thoroughly to remove the  shampoo.                           4.  Use CHG as you would any other liquid soap.  You can apply chg directly  to the skin and wash                       Gently with a scrungie or clean washcloth.  5.  Apply the CHG Soap to your body ONLY FROM THE NECK DOWN.   Do not use on face/ open                           Wound or open sores. Avoid contact with eyes, ears mouth and genitals (private parts).                       Wash face,  Genitals (private parts) with your normal soap.             6.  Wash thoroughly, paying special attention to the area where your surgery  will be performed.  7.  Thoroughly rinse your body with warm water from the neck down.  8.  DO NOT shower/wash with your normal soap after using and rinsing off  the CHG Soap.                9.  Pat yourself dry with a clean towel.            10.  Wear clean pajamas.            11.  Place clean sheets on your bed the night of your first shower and do not  sleep with pets. Day of Surgery : Do not apply any lotions/deodorants the morning of surgery.  Please wear clean clothes to the hospital/surgery center.  FAILURE TO FOLLOW THESE INSTRUCTIONS MAY RESULT IN THE CANCELLATION OF YOUR SURGERY PATIENT SIGNATURE_________________________________  NURSE SIGNATURE__________________________________  ________________________________________________________________________

## 2020-10-09 ENCOUNTER — Other Ambulatory Visit: Payer: Self-pay

## 2020-10-09 ENCOUNTER — Encounter (HOSPITAL_COMMUNITY): Payer: Self-pay

## 2020-10-09 ENCOUNTER — Encounter (HOSPITAL_COMMUNITY)
Admission: RE | Admit: 2020-10-09 | Discharge: 2020-10-09 | Disposition: A | Discharge status: Home / Self Care | Payer: 59 | Source: Ambulatory Visit | Attending: General Surgery | Admitting: General Surgery

## 2020-10-09 DIAGNOSIS — Z01812 Encounter for preprocedural laboratory examination: Secondary | ICD-10-CM | POA: Diagnosis present

## 2020-10-09 LAB — CBC
HCT: 44.8 % (ref 39.0–52.0)
Hemoglobin: 15.5 g/dL (ref 13.0–17.0)
MCH: 31.4 pg (ref 26.0–34.0)
MCHC: 34.6 g/dL (ref 30.0–36.0)
MCV: 90.7 fL (ref 80.0–100.0)
Platelets: 182 10*3/uL (ref 150–400)
RBC: 4.94 MIL/uL (ref 4.22–5.81)
RDW: 12.5 % (ref 11.5–15.5)
WBC: 5.8 10*3/uL (ref 4.0–10.5)
nRBC: 0 % (ref 0.0–0.2)

## 2020-10-09 NOTE — Progress Notes (Signed)
COVID Vaccine Completed: Yes Date COVID Vaccine completed: 07/2020 COVID vaccine manufacturer: [place]      PCP - Constance Goltz medicine at [facility]. Cardiologist -   Chest x-ray -  EKG -  Stress Test -  ECHO -  Cardiac Cath -  Pacemaker/ICD device last checked:  Sleep Study - Yes CPAP - Yes  Fasting Blood Sugar -  Checks Blood Sugar _____ times a day  Blood Thinner Instructions: Aspirin Instructions: Last Dose:  Anesthesia review: OSA(CPAP)  Patient denies shortness of breath, fever, cough and chest pain at PAT appointment   Patient verbalized understanding of instructions that were given to them at the PAT appointment. Patient was also instructed that they will need to review over the PAT instructions again at home before surgery.

## 2020-10-16 ENCOUNTER — Other Ambulatory Visit (HOSPITAL_COMMUNITY)
Admission: RE | Admit: 2020-10-16 | Discharge: 2020-10-16 | Disposition: A | Discharge status: Home / Self Care | Payer: 59 | Source: Ambulatory Visit | Attending: General Surgery | Admitting: General Surgery

## 2020-10-16 DIAGNOSIS — Z01812 Encounter for preprocedural laboratory examination: Secondary | ICD-10-CM | POA: Insufficient documentation

## 2020-10-16 DIAGNOSIS — Z20822 Contact with and (suspected) exposure to covid-19: Secondary | ICD-10-CM | POA: Insufficient documentation

## 2020-10-16 LAB — SARS CORONAVIRUS 2 (TAT 6-24 HRS): SARS Coronavirus 2: NEGATIVE

## 2020-10-20 ENCOUNTER — Ambulatory Visit (HOSPITAL_COMMUNITY): Payer: 59 | Admitting: Anesthesiology

## 2020-10-20 ENCOUNTER — Ambulatory Visit (HOSPITAL_COMMUNITY)
Admission: RE | Admit: 2020-10-20 | Discharge: 2020-10-20 | Disposition: A | Discharge status: Home / Self Care | Payer: 59 | Attending: General Surgery | Admitting: General Surgery

## 2020-10-20 ENCOUNTER — Encounter (HOSPITAL_COMMUNITY): Payer: Self-pay | Admitting: General Surgery

## 2020-10-20 ENCOUNTER — Encounter (HOSPITAL_COMMUNITY)
Admission: RE | Disposition: A | Discharge status: Home / Self Care | Payer: Self-pay | Source: Home / Self Care | Attending: General Surgery

## 2020-10-20 DIAGNOSIS — D176 Benign lipomatous neoplasm of spermatic cord: Secondary | ICD-10-CM | POA: Diagnosis not present

## 2020-10-20 DIAGNOSIS — K4090 Unilateral inguinal hernia, without obstruction or gangrene, not specified as recurrent: Secondary | ICD-10-CM | POA: Insufficient documentation

## 2020-10-20 HISTORY — PX: LIPOMA EXCISION: SHX5283

## 2020-10-20 HISTORY — PX: INGUINAL HERNIA REPAIR: SHX194

## 2020-10-20 SURGERY — REPAIR, HERNIA, INGUINAL, ADULT
Anesthesia: General | Site: Abdomen | Laterality: Right

## 2020-10-20 MED ORDER — LACTATED RINGERS IV SOLN: INTRAVENOUS | Status: DC

## 2020-10-20 MED ORDER — OXYCODONE HCL 5 MG PO TABS
5.0000 mg | ORAL_TABLET | Freq: Once | ORAL | Status: DC | PRN
Start: 2020-10-20 — End: 2020-10-21

## 2020-10-20 MED ORDER — FENTANYL CITRATE (PF) 100 MCG/2ML IJ SOLN: 50.0000 ug | INTRAMUSCULAR | Status: DC

## 2020-10-20 MED ORDER — BUPIVACAINE-EPINEPHRINE 0.25% -1:200000 IJ SOLN
INTRAMUSCULAR | Status: DC | PRN
  Administered 2020-10-20: 16 mL

## 2020-10-20 MED ORDER — CHLORHEXIDINE GLUCONATE CLOTH 2 % EX PADS: 6.0000 | MEDICATED_PAD | Freq: Once | CUTANEOUS | Status: DC

## 2020-10-20 MED ORDER — MIDAZOLAM HCL 2 MG/2ML IJ SOLN
INTRAMUSCULAR | Status: AC
  Administered 2020-10-20: 2 mg via INTRAVENOUS
  Filled 2020-10-20: qty 2

## 2020-10-20 MED ORDER — AMISULPRIDE (ANTIEMETIC) 5 MG/2ML IV SOLN
INTRAVENOUS | Status: AC
  Filled 2020-10-20: qty 2

## 2020-10-20 MED ORDER — FENTANYL CITRATE (PF) 250 MCG/5ML IJ SOLN
INTRAMUSCULAR | Status: DC | PRN
  Administered 2020-10-20: 50 ug via INTRAVENOUS
  Administered 2020-10-20: 100 ug via INTRAVENOUS

## 2020-10-20 MED ORDER — LIDOCAINE 2% (20 MG/ML) 5 ML SYRINGE
INTRAMUSCULAR | Status: AC
  Filled 2020-10-20: qty 5

## 2020-10-20 MED ORDER — FENTANYL CITRATE (PF) 100 MCG/2ML IJ SOLN
INTRAMUSCULAR | Status: AC
  Administered 2020-10-20: 25 ug via INTRAVENOUS
  Filled 2020-10-20: qty 2

## 2020-10-20 MED ORDER — OXYCODONE HCL 5 MG/5ML PO SOLN
5.0000 mg | Freq: Once | ORAL | Status: DC | PRN
Start: 2020-10-20 — End: 2020-10-21

## 2020-10-20 MED ORDER — CEFAZOLIN SODIUM-DEXTROSE 2-4 GM/100ML-% IV SOLN
2.0000 g | INTRAVENOUS | Status: AC
  Administered 2020-10-20: 2 g via INTRAVENOUS
  Filled 2020-10-20: qty 100

## 2020-10-20 MED ORDER — GABAPENTIN 100 MG PO CAPS
100.0000 mg | ORAL_CAPSULE | Freq: Three times a day (TID) | ORAL | 1 refills | Status: DC
Start: 2020-10-20 — End: 2021-05-13

## 2020-10-20 MED ORDER — DEXAMETHASONE SODIUM PHOSPHATE 10 MG/ML IJ SOLN
INTRAMUSCULAR | Status: AC
  Filled 2020-10-20: qty 1

## 2020-10-20 MED ORDER — ACETAMINOPHEN 500 MG PO TABS
1000.0000 mg | ORAL_TABLET | ORAL | Status: AC
  Administered 2020-10-20: 1000 mg via ORAL
  Filled 2020-10-20: qty 2

## 2020-10-20 MED ORDER — AMISULPRIDE (ANTIEMETIC) 5 MG/2ML IV SOLN
5.0000 mg | Freq: Once | INTRAVENOUS | Status: AC | PRN
  Administered 2020-10-20: 5 mg via INTRAVENOUS

## 2020-10-20 MED ORDER — GABAPENTIN 300 MG PO CAPS
300.0000 mg | ORAL_CAPSULE | ORAL | Status: AC
  Administered 2020-10-20: 300 mg via ORAL
  Filled 2020-10-20: qty 1

## 2020-10-20 MED ORDER — KETAMINE HCL 10 MG/ML IJ SOLN
INTRAMUSCULAR | Status: AC
  Filled 2020-10-20: qty 1

## 2020-10-20 MED ORDER — PROPOFOL 10 MG/ML IV BOLUS
INTRAVENOUS | Status: DC | PRN
  Administered 2020-10-20: 200 mg via INTRAVENOUS

## 2020-10-20 MED ORDER — ONDANSETRON HCL 4 MG/2ML IJ SOLN
INTRAMUSCULAR | Status: AC
  Filled 2020-10-20: qty 2

## 2020-10-20 MED ORDER — FENTANYL CITRATE (PF) 100 MCG/2ML IJ SOLN
INTRAMUSCULAR | Status: AC
  Administered 2020-10-20: 100 ug via INTRAVENOUS
  Filled 2020-10-20: qty 2

## 2020-10-20 MED ORDER — CHLORHEXIDINE GLUCONATE CLOTH 2 % EX PADS
6.0000 | MEDICATED_PAD | Freq: Once | CUTANEOUS | Status: DC
Start: 2020-10-20 — End: 2020-10-21

## 2020-10-20 MED ORDER — LIDOCAINE 2% (20 MG/ML) 5 ML SYRINGE
INTRAMUSCULAR | Status: DC | PRN
  Administered 2020-10-20: 60 mg via INTRAVENOUS

## 2020-10-20 MED ORDER — 0.9 % SODIUM CHLORIDE (POUR BTL) OPTIME
TOPICAL | Status: DC | PRN
  Administered 2020-10-20: 1000 mL

## 2020-10-20 MED ORDER — MIDAZOLAM HCL 2 MG/2ML IJ SOLN: 1.0000 mg | INTRAMUSCULAR | Status: DC

## 2020-10-20 MED ORDER — ONDANSETRON HCL 4 MG/2ML IJ SOLN
4.0000 mg | Freq: Once | INTRAMUSCULAR | Status: AC | PRN
  Administered 2020-10-20: 4 mg via INTRAVENOUS

## 2020-10-20 MED ORDER — PHENYLEPHRINE 40 MCG/ML (10ML) SYRINGE FOR IV PUSH (FOR BLOOD PRESSURE SUPPORT)
PREFILLED_SYRINGE | INTRAVENOUS | Status: AC
  Filled 2020-10-20: qty 10

## 2020-10-20 MED ORDER — OXYCODONE HCL 5 MG PO TABS: 5.0000 mg | ORAL_TABLET | Freq: Four times a day (QID) | ORAL | 0 refills | Status: DC | PRN

## 2020-10-20 MED ORDER — BUPIVACAINE-EPINEPHRINE (PF) 0.25% -1:200000 IJ SOLN
INTRAMUSCULAR | Status: AC
  Filled 2020-10-20: qty 30

## 2020-10-20 MED ORDER — PROPOFOL 10 MG/ML IV BOLUS
INTRAVENOUS | Status: AC
  Filled 2020-10-20: qty 20

## 2020-10-20 MED ORDER — SUGAMMADEX SODIUM 500 MG/5ML IV SOLN
INTRAVENOUS | Status: DC | PRN
  Administered 2020-10-20: 400 mg via INTRAVENOUS

## 2020-10-20 MED ORDER — ONDANSETRON HCL 4 MG/2ML IJ SOLN
INTRAMUSCULAR | Status: DC | PRN
  Administered 2020-10-20: 4 mg via INTRAVENOUS

## 2020-10-20 MED ORDER — ROCURONIUM BROMIDE 10 MG/ML (PF) SYRINGE
PREFILLED_SYRINGE | INTRAVENOUS | Status: DC | PRN
  Administered 2020-10-20: 10 mg via INTRAVENOUS
  Administered 2020-10-20: 20 mg via INTRAVENOUS
  Administered 2020-10-20: 60 mg via INTRAVENOUS

## 2020-10-20 MED ORDER — FENTANYL CITRATE (PF) 250 MCG/5ML IJ SOLN
INTRAMUSCULAR | Status: AC
  Filled 2020-10-20: qty 5

## 2020-10-20 MED ORDER — DEXAMETHASONE SODIUM PHOSPHATE 10 MG/ML IJ SOLN
INTRAMUSCULAR | Status: DC | PRN
  Administered 2020-10-20: 5 mg via INTRAVENOUS

## 2020-10-20 MED ORDER — MIDAZOLAM HCL 2 MG/2ML IJ SOLN
INTRAMUSCULAR | Status: AC
  Filled 2020-10-20: qty 2

## 2020-10-20 MED ORDER — MIDAZOLAM HCL 5 MG/5ML IJ SOLN
INTRAMUSCULAR | Status: DC | PRN
  Administered 2020-10-20: 2 mg via INTRAVENOUS

## 2020-10-20 MED ORDER — SUGAMMADEX SODIUM 500 MG/5ML IV SOLN
INTRAVENOUS | Status: AC
  Filled 2020-10-20: qty 5

## 2020-10-20 MED ORDER — CELECOXIB 200 MG PO CAPS
200.0000 mg | ORAL_CAPSULE | ORAL | Status: AC
  Administered 2020-10-20: 200 mg via ORAL
  Filled 2020-10-20: qty 1

## 2020-10-20 MED ORDER — ORAL CARE MOUTH RINSE
15.0000 mL | Freq: Once | OROMUCOSAL | Status: AC
  Administered 2020-10-20: 15 mL via OROMUCOSAL

## 2020-10-20 MED ORDER — KETAMINE HCL 10 MG/ML IJ SOLN
INTRAMUSCULAR | Status: DC | PRN
  Administered 2020-10-20: 35 mg via INTRAVENOUS

## 2020-10-20 MED ORDER — FENTANYL CITRATE (PF) 100 MCG/2ML IJ SOLN
25.0000 ug | INTRAMUSCULAR | Status: DC | PRN
  Administered 2020-10-20: 25 ug via INTRAVENOUS

## 2020-10-20 MED ORDER — CHLORHEXIDINE GLUCONATE 0.12 % MT SOLN: 15.0000 mL | Freq: Once | OROMUCOSAL | Status: AC

## 2020-10-20 SURGICAL SUPPLY — 41 items
ADH SKN CLS APL DERMABOND .7 (GAUZE/BANDAGES/DRESSINGS) ×1
APL SKNCLS STERI-STRIP NONHPOA (GAUZE/BANDAGES/DRESSINGS) ×1
BENZOIN TINCTURE PRP APPL 2/3 (GAUZE/BANDAGES/DRESSINGS) ×2 IMPLANT
BLADE HEX COATED 2.75 (ELECTRODE) ×2 IMPLANT
BLADE SURG 15 STRL LF DISP TIS (BLADE) ×1 IMPLANT
BLADE SURG 15 STRL SS (BLADE) ×2
COVER WAND RF STERILE (DRAPES) IMPLANT
DECANTER SPIKE VIAL GLASS SM (MISCELLANEOUS) ×2 IMPLANT
DERMABOND ADVANCED (GAUZE/BANDAGES/DRESSINGS) ×1
DERMABOND ADVANCED .7 DNX12 (GAUZE/BANDAGES/DRESSINGS) IMPLANT
DRAIN PENROSE 0.5X18 (DRAIN) ×2 IMPLANT
DRAPE LAPAROSCOPIC ABDOMINAL (DRAPES) ×2 IMPLANT
ELECT REM PT RETURN 15FT ADLT (MISCELLANEOUS) ×2 IMPLANT
GAUZE SPONGE 4X4 12PLY STRL (GAUZE/BANDAGES/DRESSINGS) ×2 IMPLANT
GLOVE SURG ENC MOIS LTX SZ7.5 (GLOVE) ×4 IMPLANT
GLOVE SURG UNDER POLY LF SZ7 (GLOVE) ×1 IMPLANT
GOWN STRL REUS W/ TWL XL LVL3 (GOWN DISPOSABLE) ×1 IMPLANT
GOWN STRL REUS W/TWL LRG LVL3 (GOWN DISPOSABLE) ×2 IMPLANT
GOWN STRL REUS W/TWL XL LVL3 (GOWN DISPOSABLE) ×3 IMPLANT
KIT BASIN OR (CUSTOM PROCEDURE TRAY) ×2 IMPLANT
KIT TURNOVER KIT A (KITS) ×2 IMPLANT
MESH ULTRAPRO 3X6 7.6X15CM (Mesh General) ×1 IMPLANT
NDL HYPO 25X1 1.5 SAFETY (NEEDLE) ×1 IMPLANT
NEEDLE HYPO 25X1 1.5 SAFETY (NEEDLE) ×2 IMPLANT
NS IRRIG 1000ML POUR BTL (IV SOLUTION) ×2 IMPLANT
PACK BASIC VI WITH GOWN DISP (CUSTOM PROCEDURE TRAY) ×2 IMPLANT
PENCIL SMOKE EVACUATOR (MISCELLANEOUS) ×1 IMPLANT
SPONGE LAP 18X18 RF (DISPOSABLE) ×2 IMPLANT
STRIP CLOSURE SKIN 1/2X4 (GAUZE/BANDAGES/DRESSINGS) ×2 IMPLANT
SUT MNCRL AB 4-0 PS2 18 (SUTURE) ×2 IMPLANT
SUT PROLENE 2 0 SH DA (SUTURE) ×6 IMPLANT
SUT SILK 2 0 SH (SUTURE) ×1 IMPLANT
SUT SILK 3 0 (SUTURE) ×2
SUT SILK 3-0 18XBRD TIE 12 (SUTURE) IMPLANT
SUT VIC AB 2-0 SH 27 (SUTURE)
SUT VIC AB 2-0 SH 27X BRD (SUTURE) ×2 IMPLANT
SUT VIC AB 3-0 SH 27 (SUTURE) ×2
SUT VIC AB 3-0 SH 27XBRD (SUTURE) ×2 IMPLANT
SYR BULB IRRIG 60ML STRL (SYRINGE) ×2 IMPLANT
SYR CONTROL 10ML LL (SYRINGE) ×2 IMPLANT
TOWEL OR 17X26 10 PK STRL BLUE (TOWEL DISPOSABLE) ×2 IMPLANT

## 2020-10-20 NOTE — H&P (Signed)
Keith Wade  Location: [facility] Patient #: [id number] DOB: September 05, 1965 [name] / Race: Undefined Male   History of Present: The patient is a 55 year old male who presents for a follow-up for Abdominal pain. The patient is a 55 year old male who is about 6 months status post left inguinal hernia repair with mesh. It took him about 6 months to finally not have any discomfort in the left groin. Over the last several weeks and months he has developed some pain and swelling in the right groin. He underwent a CT scan which did show a large lipoma of the right cord. He does report a burning sensation in the right groin which could be compression of the ilioinguinal nerve. He states that the bulge in the right groin seems to come and go at times. He denies any nausea or vomiting. The burning sensation is fairly constant unless he puts his feet up in the air.    Review of Systems  General Not Present- Appetite Loss, Chills, Fatigue, Fever, Night Sweats, Weight Gain and Weight Loss. Skin Not Present- Change in Wart/Mole, Dryness, Hives, Jaundice, New Lesions, Non-Healing Wounds, Rash and Ulcer. HEENT Not Present- Earache, Hearing Loss, Hoarseness, Nose Bleed, Oral Ulcers, Ringing in the Ears, Seasonal Allergies, Sinus Pain, Sore Throat, Visual Disturbances, Wears glasses/contact lenses and Yellow Eyes. Respiratory Present- Snoring. Not Present- Bloody sputum, Chronic Cough, Difficulty Breathing and Wheezing. Breast Not Present- Breast Mass, Breast Pain, Nipple Discharge and Skin Changes. Cardiovascular Not Present- Chest Pain, Difficulty Breathing Lying Down, Leg Cramps, Palpitations, Rapid Heart Rate, Shortness of Breath and Swelling of Extremities. Gastrointestinal Not Present- Abdominal Pain, Bloating, Bloody Stool, Change in Bowel Habits, Chronic diarrhea, Constipation, Difficulty Swallowing, Excessive gas, Gets full quickly at meals, Hemorrhoids, Indigestion,  Nausea, Rectal Pain and Vomiting. Male Genitourinary Present- Frequency. Not Present- Blood in Urine, Change in Urinary Stream, Impotence, Nocturia, Painful Urination, Urgency and Urine Leakage. Musculoskeletal Not Present- Back Pain, Joint Pain, Joint Stiffness, Muscle Pain, Muscle Weakness and Swelling of Extremities. Neurological Present- Headaches. Not Present- Decreased Memory, Fainting, Numbness, Seizures, Tingling, Tremor, Trouble walking and Weakness. Psychiatric Present- Depression. Not Present- Anxiety, Bipolar, Change in Sleep Pattern, Fearful and Frequent crying. Endocrine Not Present- Cold Intolerance, Excessive Hunger, Hair Changes, Heat Intolerance and New Diabetes. Hematology Not Present- Blood Thinners, Easy Bruising, Excessive bleeding, Gland problems, HIV and Persistent Infections.  Vitals Weight: 211.38 lb Height: 69in Body Surface Area: 2.11 m Body Mass Index: 31.21 kg/m  Temp.: 97.75F  Pulse: 107 (Regular)  P.OX: 95% (Room air) BP: 120/70(Sitting, Left Arm, Standard)       Physical Exam General Mental Status-Alert. General Appearance-Consistent with stated age. Hydration-Well hydrated. Voice-Normal.  Head and Neck Head-normocephalic, atraumatic with no lesions or palpable masses. Trachea-midline. Thyroid Gland Characteristics - normal size and consistency.  Eye Eyeball - Bilateral-Extraocular movements intact. Sclera/Conjunctiva - Bilateral-No scleral icterus.  Chest and Lung Exam Chest and lung exam reveals -quiet, even and easy respiratory effort with no use of accessory muscles and on auscultation, normal breath sounds, no adventitious sounds and normal vocal resonance. Inspection Chest Wall - Normal. Back - normal.  Cardiovascular Cardiovascular examination reveals -normal heart sounds, regular rate and rhythm with no murmurs and normal pedal pulses bilaterally.  Abdomen Inspection Inspection of the abdomen  reveals - No Hernias. Skin - Scar - no surgical scars. Palpation/Percussion Palpation and Percussion of the abdomen reveal - Soft, Non Tender, No Rebound tenderness, No Rigidity (guarding) and No hepatosplenomegaly. Auscultation Auscultation of  the abdomen reveals - Bowel sounds normal.  Male Genitourinary Note: There is a palpable bulge in the right groin that does seem to reduce somewhat. There is no palpable bulge or impulse with straining in the left groin   Neurologic Neurologic evaluation reveals -alert and oriented x 3 with no impairment of recent or remote memory. Mental Status-Normal.  Musculoskeletal Normal Exam - Left-Upper Extremity Strength Normal and Lower Extremity Strength Normal. Normal Exam - Right-Upper Extremity Strength Normal and Lower Extremity Strength Normal.  Lymphatic Head & Neck  General Head & Neck Lymphatics: Bilateral - Description - Normal. Axillary  General Axillary Region: Bilateral - Description - Normal. Tenderness - Non Tender. Femoral & Inguinal  Generalized Femoral & Inguinal Lymphatics: Bilateral - Description - Normal. Tenderness - Non Tender.    Assessment & Plan INGUINAL HERNIA OF LEFT SIDE WITHOUT OBSTRUCTION OR GANGRENE (K40.90) Impression: The patient is about 6 months status post left inguinal hernia repair with mesh. He is finally feeling pretty normal on the left side without any discomfort. Over the last few weeks he is developed some discomfort and swelling in the right groin. A recent CT scan showed a possible large lipoma of the cord. He is meeting with urology tomorrow to talk about this. It is hard to say whether he could have a small hernia associated with the lipoma. If he does then we could certainly offer him a hernia repair with mesh. I have discussed this with him in detail including the risks and benefits of the surgery as well as some of the technical aspects. He will meet with urology before deciding how he  would like to proceed. If he does not have surgery then we will plan to see him back on a when necessary basis. This patient encounter took 30 minutes today to perform the following: take history, perform exam, review outside records, interpret imaging, counsel the patient on their diagnosis and document encounter, findings & plan in the EHR

## 2020-10-20 NOTE — Anesthesia Preprocedure Evaluation (Signed)
Anesthesia Evaluation  Patient identified by MRN, date of birth, ID band Patient awake    Reviewed: Allergy & Precautions, NPO status , Patient's Chart, lab work & pertinent test results  Airway Mallampati: II  TM Distance: >3 FB Neck ROM: Full    Dental  (+) Teeth Intact, Dental Advisory Given   Pulmonary    breath sounds clear to auscultation       Cardiovascular  Rhythm:Regular     Neuro/Psych    GI/Hepatic   Endo/Other    Renal/GU      Musculoskeletal   Abdominal   Peds  Hematology   Anesthesia Other Findings   Reproductive/Obstetrics                             Anesthesia Physical Anesthesia Plan  ASA: II  Anesthesia Plan: General   Post-op Pain Management:  Regional for Post-op pain   Induction: Intravenous  PONV Risk Score and Plan: Ondansetron and Dexamethasone  Airway Management Planned: Oral ETT  Additional Equipment:   Intra-op Plan:   Post-operative Plan: Extubation in OR  Informed Consent: I have reviewed the patients History and Physical, chart, labs and discussed the procedure including the risks, benefits and alternatives for the proposed anesthesia with the patient or authorized representative who has indicated his/her understanding and acceptance.     Dental advisory given  Plan Discussed with: Anesthesiologist and CRNA  Anesthesia Plan Comments:         Anesthesia Quick Evaluation

## 2020-10-20 NOTE — Anesthesia Postprocedure Evaluation (Signed)
Anesthesia Post Note  Patient: Keith Wade  Procedure(s) Performed: RIGHT INGUINAL HERNIA REPAIR MESH (Right Abdomen) EXCISION RIGHT CORD LIPOMA (Right Abdomen)     Patient location during evaluation: PACU Anesthesia Type: General Level of consciousness: awake and alert Pain management: pain level controlled Vital Signs Assessment: post-procedure vital signs reviewed and stable Respiratory status: spontaneous breathing, nonlabored ventilation, respiratory function stable and patient connected to nasal cannula oxygen Cardiovascular status: blood pressure returned to baseline and stable Postop Assessment: no apparent nausea or vomiting Anesthetic complications: no   No complications documented.  Last Vitals:  Vitals:   10/20/20 1615 10/20/20 1621  BP: 117/78   Pulse: (!) 57 60  Resp: 15 13  Temp:    SpO2: 100% 100%    Last Pain:  Vitals:   10/20/20 1615  TempSrc:   PainSc: 4                  Markice Torbert COKER

## 2020-10-20 NOTE — Discharge Instructions (Signed)

## 2020-10-20 NOTE — Anesthesia Procedure Notes (Addendum)
Anesthesia Regional Block: TAP block   Pre-Anesthetic Checklist: ,, timeout performed, Correct Patient, Correct Site, Correct Laterality, Correct Procedure, Correct Position, site marked, Risks and benefits discussed, pre-op evaluation,  At surgeon's request and post-op pain management  Laterality: Right  Prep: Maximum Sterile Barrier Precautions used, chloraprep       Needles:  Injection technique: Single-shot  Needle Type: Echogenic Stimulator Needle     Needle Length: 9cm  Needle Gauge: 21     Additional Needles:   Narrative:  Start time: 10/20/2020 12:15 PM End time: 10/20/2020 12:20 PM Injection made incrementally with aspirations every 5 mL.  Performed by: Personally  Anesthesiologist: Roberts Gaudy, MD  Additional Notes: 25 cc 0.25% Bupivacaine with 1:200 epi 10 cc 1.3% Exparel injected easily

## 2020-10-20 NOTE — Progress Notes (Signed)
Assisted Dr. Joslin with right, ultrasound guided, transabdominal plane block. Side rails up, monitors on throughout procedure. See vital signs in flow sheet. Tolerated Procedure well. 

## 2020-10-20 NOTE — H&P (Signed)
CC/HPI: 06/26/2020: Patient diagnosed with a left inguinal hernia at time of last office visit in referred to general surgery for evaluation and consideration of surgical management. He eventually underwent a mesh repair in July of this year. A CT stone study was also obtained which noted no obvious nephrolithiasis or obstructive signs. He had some small bilateral simple appearing cysts.   Presents today for evaluation of ongoing pain and discomfort specifically in the right inguinal region. Referred by primary care. Seen earlier this month a treated for possible prostatitis with Cipro. The patient tells me today this made no significant improvement in regards to his ongoing symptomatology. He primarily complains of right inguinal pain and discomfort that radiates down the top of his leg. Usually worse with prolonged standing and other strenuous activity. Only alleviated when he lies on his back in elevate his leg. He also struggling with constipation and fecal urgency. This is associated with a lot of straining to pass stool. He has been taking MiraLax and recently just added Dulcolax which only resulted in watery expulsion of stool, he denies noted blood in his bowel contents. Despite this fecal urgency in need to defecate continues. At baseline he has bothersome but stable daytime frequency/urgency as well as nocturia 2-3 times nightly. He typically does not have a lot [place] hesitancy or straining to start his stream. He denies any burning or painful urination, visible blood in the urine. He drinks water throughout the day, only 1 cup of coffee in the morning but also does endorse drinking water well up until time of going to sleep. He denies associated fevers or chills, nausea/vomiting.   08/21/2020  Patient continues to have right-sided inguinal pain and discomfort. He underwent a CT scan of the abdomen and pelvis with contrast that revealed stable large right spermatic cord lipoma. No obvious hernia on the  right. He feels like there is a bulge intermittently in the right groin/scrotal area. He also continues to have pain. He saw Dr. Autumn Messing to rule out hernia. Recommendation was made to see me 1st.   10/20/2020 Patient presents today for right inguinal hernia repair versus cord lipoma excision.    ALLERGIES: Eggs Milk    MEDICATIONS: Alfuzosin Hcl Er  Fiber  Laxaclear 17 gram/dose powder  Levocetirizine Dihydrochloride     GU PSH: None   NON-GU PSH: Hernia Repair, Left, 11/2019     GU PMH: Pelvic/perineal pain - 08/11/2020, - 08/04/2020, - 07/21/2020, - 07/07/2020, - 06/26/2020 Urinary Frequency - 08/11/2020, - 08/04/2020, - 07/21/2020, - 07/07/2020, - 06/26/2020 Nocturia - 08/04/2020, - 06/26/2020, - 12/26/2019 BPH w/LUTS - 06/26/2020, His LUT's returned to baseline and he is content with the symptoms on alfuzosin. If he passed a stone, that could have increased his voiding symptoms when the stone was in the distal ureter. , - 12/26/2019 History of urolithiasis, His urine is clear and he has no flank pain currently but with the LLQ pain, the hernia and the episode lower urinary tract symptoms I think a CT stone study will be useful to determine if he does have stones and also to determine if the hernia has a bladder component that could impact voiding. I was going to get that today but he needs a prior auth based on today's note. - 12/26/2019 Unil Inguinal Hernia W/O obst or gang,non-recurrent, He has a new left inguinal hernia with some LLQ pain. He will reduce his exercise and will be referred to general surgery for evaluation. - 12/26/2019  NON-GU PMH: Muscle weakness (generalized) - 08/11/2020, - 08/04/2020, - 07/21/2020, - 07/07/2020 Other muscle spasm - 08/11/2020, - 08/04/2020, - 07/21/2020, - 07/07/2020 Other specified disorders of muscle - 08/11/2020, - 08/04/2020, - 07/21/2020, - 07/07/2020 Depression Sleep Apnea    FAMILY HISTORY: 3 daughters - Other Hematuria - Father   SOCIAL HISTORY: Marital  Status: Single Preferred Language: English Current Smoking Status: Patient has never smoked.   Tobacco Use Assessment Completed: Used Tobacco in last 30 days? Drinks 2 drinks per month.  Does not use drugs. Drinks 1 caffeinated drink per day. 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    REVIEW OF SYSTEMS:    GU Review Male:   Patient reports get up at night to urinate. Patient denies frequent urination, hard to postpone urination, burning/ pain with urination, leakage of urine, stream starts and stops, trouble starting your stream, have to strain to urinate , erection problems, and penile pain.  Gastrointestinal (Upper):   Patient denies nausea, vomiting, and indigestion/ heartburn.  Gastrointestinal (Lower):   Patient denies diarrhea and constipation.  Constitutional:   Patient denies fever, night sweats, weight loss, and fatigue.  Skin:   Patient denies skin rash/ lesion and itching.  Eyes:   Patient denies blurred vision and double vision.  Ears/ Nose/ Throat:   Patient denies sore throat and sinus problems.  Hematologic/Lymphatic:   Patient denies swollen glands and easy bruising.  Cardiovascular:   Patient denies leg swelling and chest pains.  Respiratory:   Patient denies cough and shortness of breath.  Endocrine:   Patient denies excessive thirst.  Musculoskeletal:   Patient denies back pain and joint pain.  Neurological:   Patient denies headaches and dizziness.  Psychologic:   Patient denies depression and anxiety.   Ht 5\' 10"  (1.778 m)   Wt 93 kg   BMI 29.42 kg/m    GU PHYSICAL EXAMINATION:    Testes: Bilateral testicles descended without masses. Right fullness along the spermatic cord consistent with potential lipoma. I did not palpate an obvious hernia  Penis: Circumcised, no warts, no cracks. No dorsal Peyronie's plaques, no left corporal Peyronie's plaques, no right corporal Peyronie's plaques, no scarring, no warts. No balanitis, no meatal stenosis.    MULTI-SYSTEM PHYSICAL EXAMINATION:       Complexity of Data:  Source Of History:  Patient  Records Review:   Previous Patient Records  X-Ray Review: C.T. Abdomen/Pelvis: Reviewed Films. Reviewed Report. Discussed With Patient.     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 I will speak with Dr. Marlou Starks about possible combination surgery. Risk of bleeding, infection, injury to surrounding structures, testicular injury or loss, need for additional procedures, inability to relieve the pain, worsening pain were discussed.   Cc: Dr. Sheryn Bison  Dr Marlou Starks

## 2020-10-20 NOTE — Op Note (Signed)
10/20/2020  2:57 PM  PATIENT:  Keith Wade  55 y.o. male  PRE-OPERATIVE DIAGNOSIS:  RIGHT GROIN PAIN  POST-OPERATIVE DIAGNOSIS:  Right inguinal hernia, lipoma of cord  PROCEDURE:  Procedure(s) with comments: RIGHT INGUINAL HERNIA REPAIR MESH  SURGEON:  Surgeon(s) and Role: Panel 1:    * Jovita Kussmaul, MD - Primary  PHYSICIAN ASSISTANT:   ASSISTANTS: Dr. Gloriann Loan   ANESTHESIA:   local and general  EBL:  25 mL   BLOOD ADMINISTERED:none  DRAINS: none   LOCAL MEDICATIONS USED:  MARCAINE     SPECIMEN:  Source of Specimen:  right inguinal hernia sac and lipoma of cord  DISPOSITION OF SPECIMEN:  PATHOLOGY  COUNTS:  YES  TOURNIQUET:  * No tourniquets in log *  DICTATION: .Dragon Dictation   After informed consent was obtained the patient was brought to the operating room and placed in the supine position on the operating table.  After adequate induction of general anesthesia the patient's abdomen and right groin were prepped with ChloraPrep, allowed to dry, and draped in usual sterile manner.  An appropriate timeout was performed.  The right groin was then infiltrated with quarter percent Marcaine.  A small incision was made from the edge of the pubic tubercle on the right towards the anterior superior iliac spine with a 15 blade knife.  The incision was carried through the skin and subcutaneous tissue sharply with electrocautery until the fascia of the external oblique was encountered.  A small bridging vein was clamped with hemostats, divided, and ligated with 3-0 silk ties.  The external oblique fascia was opened along its fibers towards the apex of the external ring with a 15 blade knife and Metzenbaum scissors.  A [place] retractor was deployed.  Blunt dissection was carried out of the cord structures until they could be surrounded between 2 fingers.  1/2 inch Penrose drain was placed around the cord structures for retraction purposes.  The cord was then gently skeletonized by  blunt hemostat dissection.  I was able to identify a large lipoma the cord that was clamped near its base with hemostats, divided, and ligated with 3-0 silk ties.  The rest of the lipoma was removed and sent to pathology.  With this there was also a small hernia sac identified.  This was also dissected bluntly away from the rest of the cord structures.  Care was taken to avoid injury to the vas deferens and testicular artery.  The sac was then opened and there were no visceral contents within the sac.  The sac was ligated at its base with a 2-0 silk suture ligature and the distal sac was excised and also sent to pathology.  The stump of the sac was allowed to retract back beneath the transversalis muscle.  The floor of the canal was then reinforced with a 3 x 6 piece of ultra Pro mesh.  The mesh was sewed inferiorly to the shelving edge of the inguinal ligament with a running 2-0 Prolene stitch.  Tails were cut in the mesh laterally and the tails were wrapped around the cord structures.  Superiorly the mesh was sewn to the musculoaponeurotic strength layer of the transversalis with interrupted 2-0 Prolene vertical mattress stitches.  The tails were anchored to the shelving edge of the inguinal ligament lateral to the cord with interrupted 2-0 Prolene stitches.  Once this was accomplished the mesh was in good position the hernia seem well repaired.  The wound was irrigated with copious amounts  of saline.  During the dissection the ilioinguinal nerve was identified and involved with some scar tissue.  It was dissected out proximally distally, clamped, divided, and ligated with 3-0 silk ties.  Next the external oblique fascia was reapproximated with a running 2-0 Vicryl stitch.  The wound was infiltrated with more quarter percent Marcaine.  The subcutaneous fascia was then reapproximated with a running 3-0 Vicryl stitch.  The skin was closed with a running 4-0 Monocryl subcuticular stitch.  Dermabond dressings were  applied.  The patient tolerated the procedure well.  At the end of the case all needle sponge and instrument counts were correct.  The patient was then awakened and taken recovery in stable condition.  Dr. Gloriann Loan was present for him most of the case to inspect the cord and evaluate the lipoma.  PLAN OF CARE: Discharge to home after PACU  PATIENT DISPOSITION:  PACU - hemodynamically stable.   Delay start of Pharmacological VTE agent (>24hrs) due to surgical blood loss or risk of bleeding: not applicable

## 2020-10-20 NOTE — Anesthesia Procedure Notes (Signed)
Procedure Name: Intubation Date/Time: 10/20/2020 1:48 PM Performed by: Lollie Sails, CRNA Pre-anesthesia Checklist: Patient identified, Emergency Drugs available, Suction available, Patient being monitored and Timeout performed Patient Re-evaluated:Patient Re-evaluated prior to induction Oxygen Delivery Method: Circle system utilized Preoxygenation: Pre-oxygenation with 100% oxygen Induction Type: IV induction Ventilation: Mask ventilation without difficulty Laryngoscope Size: Miller and 3 Grade View: Grade II Tube type: Oral Tube size: 7.5 mm Number of attempts: 1 Airway Equipment and Method: Stylet Placement Confirmation: ETT inserted through vocal cords under direct vision,  positive ETCO2 and breath sounds checked- equal and bilateral Secured at: 23 cm Tube secured with: Tape Dental Injury: Teeth and Oropharynx as per pre-operative assessment

## 2020-10-20 NOTE — Op Note (Signed)
Operative Note  Preoperative diagnosis:  1.  Right inguinal hernia versus cord lipoma  Postoperative diagnosis: 1.  Right inguinal hernia and cord lipoma  Procedure(s): 1.  Excision of right-sided cord lipoma  Cosurgeon: Link Snuffer, MD  Co-surgeon: Autumn Messing, MD  Assistants: None  Anesthesia: General  Complications: None immediate  EBL: Minimal  Specimens: 1.  Cord lipoma and hernia sac  Drains/Catheters: 1.  None  Intraoperative findings: 1.  Large cord lipoma that was excised.  There is clearly a hernia as well that was repaired and dictated separately by Dr. Marlou Starks  Indication: 55 year old male with right-sided inguinal swelling consistent with right sided inguinal hernia.  He underwent imaging studies that revealed right-sided cord lipoma.  Given that he clinically had a hernia but on imaging that looked like a possible lipoma, decision was made to proceed to the operating room together with general surgery, Dr. Marlou Starks.  Description of procedure:  The patient was identified and consent was obtained.  The patient was taken to the operating room and placed in the supine position.  The patient was placed under general anesthesia.  Perioperative antibiotics were administered.  Patient was prepped and draped in a standard sterile fashion and a timeout was performed.  An approximately 5 cm inguinal incision was made sharply.  This was carried down with Bovie electrocautery down to the level of the external oblique aponeurosis.  A sharp punch incision was made into the external oblique aponeurosis and subsequently opened along the fibers with Metzenbaum scissors.  The spermatic cord was dissected out and a Penrose drain was passed underneath it.  Cremasteric fibers were divided.  Cord structures were clearly identified and a cord lipoma was dissected away from the cord.  This was tied off with a 2-0 silk suture and then excised.  It was passed off for specimen.  The hernia sac was then  dissected off and tied off with a 2-0 silk suture and excised and passed off for specimen.  This concluded my portion of helping with the operation.  I turned the case over to Dr. Marlou Starks.  Please see his separate dictated note for full details of his complete portion of the case.  Plan: Plan per general surgery.

## 2020-10-20 NOTE — Transfer of Care (Signed)
Immediate Anesthesia Transfer of Care Note  Patient: Keith Wade  Procedure(s) Performed: RIGHT INGUINAL HERNIA REPAIR MESH (Right Abdomen) EXCISION RIGHT CORD LIPOMA (Right Abdomen)  Patient Location: PACU  Anesthesia Type:GA combined with regional for post-op pain  Level of Consciousness: awake, drowsy and responds to stimulation  Airway & Oxygen Therapy: Patient Spontanous Breathing and Patient connected to face mask oxygen  Post-op Assessment: Report given to RN and Post -op Vital signs reviewed and stable  Post vital signs: Reviewed and stable  Last Vitals:  Vitals Value Taken Time  BP 108/69 10/20/20 1504  Temp    Pulse 62 10/20/20 1506  Resp 10 10/20/20 1506  SpO2 100 % 10/20/20 1506  Vitals shown include unvalidated device data.  Last Pain:  Vitals:   10/20/20 1140  TempSrc: Oral  PainSc: 9          Complications: No complications documented.

## 2020-10-20 NOTE — Interval H&P Note (Signed)
History and Physical Interval Note:  10/20/2020 12:59 PM  Keith Wade  has presented today for surgery, with the diagnosis of RIGHT GROIN PAIN.  The various methods of treatment have been discussed with the patient and family. After consideration of risks, benefits and other options for treatment, the patient has consented to  Procedure(s) with comments: [place] (Right) - 120 TOTAL - COMBINED CASE WITH DR BELL - DR BELL AND DR TOTH STARTING / WORKING TOGETHER ON THIS CASE EXCISION RIGHT CORD LIPOMA (Right) as a surgical intervention.  The patient's history has been reviewed, patient examined, no change in status, stable for surgery.  I have reviewed the patient's chart and labs.  Questions were answered to the patient's satisfaction.     Autumn Messing III

## 2020-10-21 ENCOUNTER — Encounter (HOSPITAL_COMMUNITY): Payer: Self-pay | Admitting: General Surgery

## 2020-10-22 LAB — SURGICAL PATHOLOGY

## 2021-04-27 ENCOUNTER — Other Ambulatory Visit: Payer: Self-pay | Admitting: General Surgery

## 2021-04-27 DIAGNOSIS — R140 Abdominal distension (gaseous): Secondary | ICD-10-CM

## 2021-04-28 ENCOUNTER — Other Ambulatory Visit: Payer: Self-pay

## 2021-04-28 ENCOUNTER — Ambulatory Visit
Admission: RE | Admit: 2021-04-28 | Discharge: 2021-04-28 | Disposition: A | Discharge status: Home / Self Care | Payer: 59 | Source: Ambulatory Visit | Attending: General Surgery | Admitting: General Surgery

## 2021-04-28 DIAGNOSIS — R140 Abdominal distension (gaseous): Secondary | ICD-10-CM

## 2021-04-28 MED ORDER — IOPAMIDOL (ISOVUE-300) INJECTION 61%
100.0000 mL | Freq: Once | INTRAVENOUS | Status: AC | PRN
  Administered 2021-04-28: 100 mL via INTRAVENOUS

## 2021-05-12 NOTE — Progress Notes (Signed)
Synopsis: Referred for pleural effusions by Carolee Rota, NP  Subjective:   PATIENT ID: Keith Wade GENDER: male DOB: 1966-01-18, MRN: [id number]  Chief Complaint  Patient presents with   Consult    Consult for pleural effusion. Abdominal CT 04/28/21. Ct was obtained due to hernia issues and pleural effusions. Reports SOB in the morning and dry cough. No smoking history.    55yM with history of OSA on CPAP (has panasonic device) who is referred for pleural effusions.Never had asthma. Never had covid infection - he has been vaccinated and boosted for it.  He says he's been having some dyspnea when he wakes up - not necessarily exertion. Sometimes he feels like he can't breathe in through nose. Sometimes he feels like he has sinus congestion. No PND sensation. No weight gain or weight loss recently.    He sometimes has cough in the morning but very sporadic.  Otherwise pertinent review of systems is negative.    He never smoked. Did have secondhand exposure. He has no woodstove at home. He works on cars, mainly selling cars. Occasional brake pad work but not a ton. He has never lived outside of [place]. No recent travel outside of [place], no pets.   Past Medical History:  Diagnosis Date   Enlarged prostate    History of kidney stones    Sleep apnea    cpap at night     No family history on file.   Past Surgical History:  Procedure Laterality Date   COLONOSCOPY     INGUINAL HERNIA REPAIR Left 01/24/2020   Procedure: LEFT INGUINAL HERNIA REPAIR WITH MESH;  Surgeon: Jovita Kussmaul, MD;  Location: [facility];  Service: General;  Laterality: Left;   INGUINAL HERNIA REPAIR Right 10/20/2020   Procedure: RIGHT INGUINAL HERNIA REPAIR MESH;  Surgeon: Jovita Kussmaul, MD;  Location: [facility];  Service: General;  Laterality: Right;  120 TOTAL - COMBINED CASE WITH DR BELL - DR BELL AND DR [place] / WORKING TOGETHER ON THIS CASE   LIPOMA EXCISION Right 10/20/2020   Procedure:  EXCISION RIGHT CORD LIPOMA;  Surgeon: Lucas Mallow, MD;  Location: [facility];  Service: Urology;  Laterality: Right;    Social History   Socioeconomic History   Marital status: Divorced    Spouse name: Not on file   Number of children: Not on file   Years of education: Not on file   Highest education level: Not on file  Occupational History   Not on file  Tobacco Use   Smoking status: Never   Smokeless tobacco: Never  Vaping Use   Vaping Use: Never used  Substance and Sexual Activity   Alcohol use: Yes    Comment: occas.   Drug use: Never   Sexual activity: Not on file  Other Topics Concern   Not on file  Social History Narrative   Not on file   Social Determinants of Health   Financial Resource Strain: Not on file  Food Insecurity: Not on file  Transportation Needs: Not on file  Physical Activity: Not on file  Stress: Not on file  Social Connections: Not on file  Intimate Partner Violence: Not on file     Allergies  Allergen Reactions   Eggs Or Egg-Derived Products     Gi intolerance    Milk-Related Compounds      Outpatient Medications Prior to Visit  Medication Sig Dispense Refill   alfuzosin (UROXATRAL) 10 MG 24  hr tablet Take 10 mg by mouth daily.     Ascorbic Acid (VITAMIN C PO) Take 1 tablet by mouth daily.     CALCIUM PO Take 1 tablet by mouth daily.     cholecalciferol (VITAMIN D3) 25 MCG (1000 UNIT) tablet Take 1,000 Units by mouth daily.     clonazePAM (KLONOPIN) 1 MG tablet Take 1 mg by mouth 2 (two) times daily as needed for anxiety.     fluticasone (FLONASE) 50 MCG/ACT nasal spray Place 1 spray into both nostrils daily as needed for allergies or rhinitis.     ibuprofen (ADVIL) 200 MG tablet Take 400 mg by mouth every 6 (six) hours as needed for moderate pain.     meloxicam (MOBIC) 15 MG tablet Take 15 mg by mouth daily.     Multiple Vitamin (MULTIVITAMIN ADULT PO) Take 1 tablet by mouth daily.     vitamin E 180 MG (400 UNITS) capsule Take 400  Units by mouth daily.     valACYclovir (VALTREX) 500 MG tablet Take 1,000 mg by mouth daily.     gabapentin (NEURONTIN) 100 MG capsule Take 1 capsule (100 mg total) by mouth 3 (three) times daily. (Patient not taking: Reported on 05/13/2021) 60 capsule 1   oxyCODONE (OXY IR/ROXICODONE) 5 MG immediate release tablet Take 1-2 tablets (5-10 mg total) by mouth every 6 (six) hours as needed for moderate pain, severe pain or breakthrough pain. (Patient not taking: Reported on 05/13/2021) 20 tablet 0   No facility-administered medications prior to visit.       Objective:   Physical Exam:  General appearance: 55 y.o., male, NAD, conversant  Eyes: anicteric sclerae, moist conjunctivae; no lid-lag; PERRL, tracking appropriately HENT: NCAT; oropharynx, MMM, no mucosal ulcerations; normal hard and soft palate Neck: Trachea midline; no lymphadenopathy, no JVD Lungs: CTAB, no crackles, no wheeze, with normal respiratory effort CV: RRR, no MRGs  Abdomen: Soft, non-tender; non-distended, BS present  Extremities: No peripheral edema, radial and DP pulses present bilaterally  Skin: Normal temperature, turgor and texture; no rash Psych: Appropriate affect Neuro: Alert and oriented to person and place, no focal deficit    Vitals:   05/13/21 1055  BP: 122/78  Pulse: 74  Temp: 97.8 F (36.6 C)  TempSrc: Oral  SpO2: 97%  Weight: 215 lb (97.5 kg)  Height: 5\' 9"  (1.753 m)   97% on RA BMI Readings from Last 3 Encounters:  05/13/21 31.75 kg/m  10/20/20 29.42 kg/m  10/09/20 29.41 kg/m   Wt Readings from Last 3 Encounters:  05/13/21 215 lb (97.5 kg)  10/20/20 205 lb 0.4 oz (93 kg)  10/09/20 205 lb (93 kg)     CBC    Component Value Date/Time   WBC 5.8 10/09/2020 1154   RBC 4.94 10/09/2020 1154   HGB 15.5 10/09/2020 1154   HCT 44.8 10/09/2020 1154   PLT 182 10/09/2020 1154   MCV 90.7 10/09/2020 1154   MCH 31.4 10/09/2020 1154   MCHC 34.6 10/09/2020 1154   RDW 12.5 10/09/2020 1154    LYMPHSABS 1.7 03/17/2018 1917   MONOABS 0.7 03/17/2018 1917   EOSABS 0.2 03/17/2018 1917   BASOSABS 0.0 03/17/2018 1917    Chest Imaging: CT A/P 04/28/21 reviewed by me with trace (if present) bilateral pleural effusions stable since prior CT A/P  Pulmonary Functions Testing Results: No flowsheet data found.    Assessment & Plan:   # Bilateral pleural effusions: They are very small and not amenable to thoracentesis. I'm  not fully convinced that he even has effusion and that it's not just either a spot of pleural or chest wall thickening.   # DOE: Unclear etiology but mild. 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   Plan: - CXR PA/lateral next visit to at least make sure pleural effusion isn't accumulating - If worsening orthopnea or trouble breathing when lying on right side he'll send our clinic a message and we can obtain CXR sooner - PFTs next visit - consider BNP, BMP, CBC/diff, TSH if DOE persistent/worsened next visit  I spent 48 minutes dedicated to the care of this patient on the date of this encounter to include pre-visit review of records, face-to-face time with the patient discussing conditions above, post visit ordering of testing, clinical documentation with the electronic health record, making appropriate referrals as documented, and communicating necessary findings to members of the patients care team.     Maryjane Hurter, MD [facility] 05/13/2021 11:20 AM

## 2021-05-13 ENCOUNTER — Ambulatory Visit (INDEPENDENT_AMBULATORY_CARE_PROVIDER_SITE_OTHER): Payer: 59 | Admitting: Student

## 2021-05-13 ENCOUNTER — Encounter: Payer: Self-pay | Admitting: Student

## 2021-05-13 ENCOUNTER — Other Ambulatory Visit: Payer: Self-pay

## 2021-05-13 VITALS — BP 122/78 | HR 74 | Temp 97.8°F | Ht 69.0 in | Wt 215.0 lb

## 2021-05-13 DIAGNOSIS — J90 Pleural effusion, not elsewhere classified: Secondary | ICD-10-CM

## 2021-05-13 DIAGNOSIS — R0609 Other forms of dyspnea: Secondary | ICD-10-CM

## 2021-05-13 NOTE — Patient Instructions (Addendum)
-Talk to GI about getting an appointment to discuss bloating -Chest x ray and breathing tests (PFTs) next visit   Low-FODMAP Eating Plan FODMAP stands for fermentable oligosaccharides, disaccharides, monosaccharides, and polyols. These are sugars that are hard for some people to digest. A low-FODMAP eating plan may help some people who have irritable bowel syndrome (IBS) and certain other bowel (intestinal) diseases to manage their symptoms. This meal plan can be complicated to follow. Work with a diet and nutrition specialist (dietitian) to make a low-FODMAP eating plan that is right for you. A dietitian can help make sure that you get enough nutrition from this diet. What are tips for following this plan? Reading food labels Check labels for hidden FODMAPs such as: High-fructose syrup. Honey. Agave. Natural fruit flavors. Onion or garlic powder. Choose low-FODMAP foods that contain 3-4 grams of fiber per serving. Check food labels for serving sizes. Eat only one serving at a time to make sure FODMAP levels stay low. Shopping Shop with a list of foods that are recommended on this diet and make a meal plan. Meal planning Follow a low-FODMAP eating plan for up to 6 weeks, or as told by your health care provider or dietitian. To follow the eating plan: Eliminate high-FODMAP foods from your diet completely. Choose only low-FODMAP foods to eat. You will do this for 2-6 weeks. Gradually reintroduce high-FODMAP foods into your diet one at a time. Most people should wait a few days before introducing the next new high-FODMAP food into their meal plan. Your dietitian can recommend how quickly you may reintroduce foods. Keep a daily record of what and how much you eat and drink. Make note of any symptoms that you have after eating. Review your daily record with a dietitian regularly to identify which foods you can eat and which foods you should avoid. General tips Drink enough fluid each day to  keep your urine pale yellow. Avoid processed foods. These often have added sugar and may be high in FODMAPs. Avoid most dairy products, whole grains, and sweeteners. Work with a dietitian to make sure you get enough fiber in your diet. Avoid high FODMAP foods at meals to manage symptoms. Recommended foods Fruits Bananas, oranges, tangerines, lemons, limes, blueberries, raspberries, strawberries, grapes, cantaloupe, honeydew melon, kiwi, papaya, passion fruit, and pineapple. Limited amounts of dried cranberries, banana chips, and shredded coconut. Vegetables Eggplant, zucchini, cucumber, peppers, green beans, bean sprouts, lettuce, arugula, kale, Swiss chard, spinach, collard greens, bok choy, summer squash, potato, and tomato. Limited amounts of corn, carrot, and sweet potato. Green parts of scallions. Grains Gluten-free grains, such as rice, oats, buckwheat, quinoa, corn, polenta, and millet. Gluten-free pasta, bread, or cereal. Rice noodles. Corn tortillas. Meats and other proteins Unseasoned beef, pork, poultry, or fish. Eggs. Berniece Salines. Tofu (firm) and tempeh. Limited amounts of nuts and seeds, such as almonds, walnuts, [place] nuts, pecans, peanuts, nut butters, pumpkin seeds, chia seeds, and sunflower seeds. Dairy Lactose-free milk, yogurt, and kefir. Lactose-free cottage cheese and ice cream. Non-dairy milks, such as almond, coconut, hemp, and rice milk. Non-dairy yogurt. Limited amounts of goat cheese, brie, mozzarella, parmesan, swiss, and other hard cheeses. Fats and oils Butter-free spreads. Vegetable oils, such as olive, canola, and sunflower oil. Seasoning and other foods Artificial sweeteners with names that do not end in "ol," such as aspartame, saccharine, and stevia. Maple syrup, white table sugar, raw sugar, brown sugar, and molasses. Mayonnaise, soy sauce, and tamari. Fresh basil, coriander, parsley, rosemary, and thyme. Beverages Water and  mineral water. Sugar-sweetened soft  drinks. Small amounts of orange juice or cranberry juice. Black and green tea. Most dry wines. Coffee. The items listed above may not be a complete list of foods and beverages you can eat. Contact a dietitian for more information. Foods to avoid Fruits Fresh, dried, and juiced forms of apple, pear, watermelon, peach, plum, cherries, apricots, blackberries, boysenberries, figs, nectarines, and mango. Avocado. Vegetables Chicory root, artichoke, asparagus, cabbage, snow peas, Brussels sprouts, broccoli, sugar snap peas, mushrooms, celery, and cauliflower. Onions, garlic, leeks, and the white part of scallions. Grains Wheat, including kamut, durum, and semolina. Barley and bulgur. Couscous. Wheat-based cereals. Wheat noodles, bread, crackers, and pastries. Meats and other proteins Fried or fatty meat. Sausage. Cashews and pistachios. Soybeans, baked beans, black beans, chickpeas, kidney beans, fava beans, navy beans, lentils, black-eyed peas, and split peas. Dairy Milk, yogurt, ice cream, and soft cheese. Cream and sour cream. Milk-based sauces. Custard. Buttermilk. Soy milk. Seasoning and other foods Any sugar-free gum or candy. Foods that contain artificial sweeteners such as sorbitol, mannitol, isomalt, or xylitol. Foods that contain honey, high-fructose corn syrup, or agave. Bouillon, vegetable stock, beef stock, and chicken stock. Garlic and onion powder. Condiments made with onion, such as hummus, chutney, pickles, relish, salad dressing, and salsa. Tomato paste. Beverages Chicory-based drinks. Coffee substitutes. Chamomile tea. Fennel tea. Sweet or fortified wines such as port or sherry. Diet soft drinks made with isomalt, mannitol, maltitol, sorbitol, or xylitol. Apple, pear, and mango juice. Juices with high-fructose corn syrup. The items listed above may not be a complete list of foods and beverages you should avoid. Contact a dietitian for more information. Summary FODMAP stands for  fermentable oligosaccharides, disaccharides, monosaccharides, and polyols. These are sugars that are hard for some people to digest. A low-FODMAP eating plan is a short-term diet that helps to ease symptoms of certain bowel diseases. The eating plan usually lasts up to 6 weeks. After that, high-FODMAP foods are reintroduced gradually and one at a time. This can help you find out which foods may be causing symptoms. A low-FODMAP eating plan can be complicated. It is best to work with a dietitian who has experience with this type of plan. This information is not intended to replace advice given to you by your health care provider. Make sure you discuss any questions you have with your health care provider. Document Revised: 11/01/2019 Document Reviewed: 11/01/2019 Elsevier Patient Education  [place].

## 2021-07-06 DIAGNOSIS — Z86010 Personal history of colonic polyps: Secondary | ICD-10-CM | POA: Diagnosis not present

## 2021-07-06 DIAGNOSIS — K625 Hemorrhage of anus and rectum: Secondary | ICD-10-CM | POA: Diagnosis not present

## 2021-07-06 DIAGNOSIS — R140 Abdominal distension (gaseous): Secondary | ICD-10-CM | POA: Diagnosis not present

## 2021-07-21 DIAGNOSIS — K625 Hemorrhage of anus and rectum: Secondary | ICD-10-CM | POA: Diagnosis not present

## 2021-09-21 DIAGNOSIS — R5383 Other fatigue: Secondary | ICD-10-CM | POA: Diagnosis not present

## 2021-09-21 DIAGNOSIS — Z13220 Encounter for screening for lipoid disorders: Secondary | ICD-10-CM | POA: Diagnosis not present

## 2021-09-21 DIAGNOSIS — Z131 Encounter for screening for diabetes mellitus: Secondary | ICD-10-CM | POA: Diagnosis not present

## 2021-09-21 DIAGNOSIS — N401 Enlarged prostate with lower urinary tract symptoms: Secondary | ICD-10-CM | POA: Diagnosis not present

## 2021-09-22 ENCOUNTER — Other Ambulatory Visit: Payer: Self-pay | Admitting: Urology

## 2021-09-22 DIAGNOSIS — R140 Abdominal distension (gaseous): Secondary | ICD-10-CM | POA: Diagnosis not present

## 2021-09-22 DIAGNOSIS — R12 Heartburn: Secondary | ICD-10-CM | POA: Diagnosis not present

## 2021-09-22 DIAGNOSIS — R197 Diarrhea, unspecified: Secondary | ICD-10-CM | POA: Diagnosis not present

## 2021-09-22 DIAGNOSIS — D4102 Neoplasm of uncertain behavior of left kidney: Secondary | ICD-10-CM

## 2021-09-22 DIAGNOSIS — K6389 Other specified diseases of intestine: Secondary | ICD-10-CM | POA: Diagnosis not present

## 2021-09-24 DIAGNOSIS — G4733 Obstructive sleep apnea (adult) (pediatric): Secondary | ICD-10-CM | POA: Diagnosis not present

## 2021-10-06 ENCOUNTER — Ambulatory Visit
Admission: RE | Admit: 2021-10-06 | Discharge: 2021-10-06 | Disposition: A | Discharge status: Home / Self Care | Payer: 59 | Source: Ambulatory Visit | Attending: Urology | Admitting: Urology

## 2021-10-06 DIAGNOSIS — N281 Cyst of kidney, acquired: Secondary | ICD-10-CM | POA: Diagnosis not present

## 2021-10-06 DIAGNOSIS — K7689 Other specified diseases of liver: Secondary | ICD-10-CM | POA: Diagnosis not present

## 2021-10-06 DIAGNOSIS — J90 Pleural effusion, not elsewhere classified: Secondary | ICD-10-CM | POA: Diagnosis not present

## 2021-10-06 DIAGNOSIS — D4102 Neoplasm of uncertain behavior of left kidney: Secondary | ICD-10-CM

## 2021-10-06 DIAGNOSIS — N2889 Other specified disorders of kidney and ureter: Secondary | ICD-10-CM | POA: Diagnosis not present

## 2021-10-06 MED ORDER — GADOBENATE DIMEGLUMINE 529 MG/ML IV SOLN
20.0000 mL | Freq: Once | INTRAVENOUS | Status: AC | PRN
  Administered 2021-10-06: 20 mL via INTRAVENOUS

## 2021-10-25 DIAGNOSIS — G4733 Obstructive sleep apnea (adult) (pediatric): Secondary | ICD-10-CM | POA: Diagnosis not present

## 2021-10-26 DIAGNOSIS — K589 Irritable bowel syndrome without diarrhea: Secondary | ICD-10-CM | POA: Diagnosis not present

## 2021-10-26 DIAGNOSIS — K639 Disease of intestine, unspecified: Secondary | ICD-10-CM | POA: Diagnosis not present

## 2021-10-26 DIAGNOSIS — R140 Abdominal distension (gaseous): Secondary | ICD-10-CM | POA: Diagnosis not present

## 2021-10-26 DIAGNOSIS — Z86010 Personal history of colonic polyps: Secondary | ICD-10-CM | POA: Diagnosis not present

## 2021-11-16 DIAGNOSIS — K625 Hemorrhage of anus and rectum: Secondary | ICD-10-CM | POA: Diagnosis not present

## 2021-11-24 DIAGNOSIS — G4733 Obstructive sleep apnea (adult) (pediatric): Secondary | ICD-10-CM | POA: Diagnosis not present

## 2021-12-24 DIAGNOSIS — G4733 Obstructive sleep apnea (adult) (pediatric): Secondary | ICD-10-CM | POA: Diagnosis not present

## 2022-01-23 DIAGNOSIS — G4733 Obstructive sleep apnea (adult) (pediatric): Secondary | ICD-10-CM | POA: Diagnosis not present

## 2022-02-23 DIAGNOSIS — G4733 Obstructive sleep apnea (adult) (pediatric): Secondary | ICD-10-CM | POA: Diagnosis not present

## 2022-04-30 DIAGNOSIS — G4733 Obstructive sleep apnea (adult) (pediatric): Secondary | ICD-10-CM | POA: Diagnosis not present

## 2022-05-24 DIAGNOSIS — M545 Low back pain, unspecified: Secondary | ICD-10-CM | POA: Diagnosis not present

## 2022-09-06 DIAGNOSIS — Z0000 Encounter for general adult medical examination without abnormal findings: Secondary | ICD-10-CM | POA: Diagnosis not present

## 2022-09-06 DIAGNOSIS — Z13220 Encounter for screening for lipoid disorders: Secondary | ICD-10-CM | POA: Diagnosis not present

## 2022-09-06 DIAGNOSIS — Z125 Encounter for screening for malignant neoplasm of prostate: Secondary | ICD-10-CM | POA: Diagnosis not present

## 2022-12-02 DIAGNOSIS — N5201 Erectile dysfunction due to arterial insufficiency: Secondary | ICD-10-CM | POA: Diagnosis not present

## 2023-01-14 DIAGNOSIS — G4733 Obstructive sleep apnea (adult) (pediatric): Secondary | ICD-10-CM | POA: Diagnosis not present

## 2023-02-14 DIAGNOSIS — G4733 Obstructive sleep apnea (adult) (pediatric): Secondary | ICD-10-CM | POA: Diagnosis not present

## 2023-09-26 DIAGNOSIS — Z125 Encounter for screening for malignant neoplasm of prostate: Secondary | ICD-10-CM | POA: Diagnosis not present

## 2023-09-26 DIAGNOSIS — E559 Vitamin D deficiency, unspecified: Secondary | ICD-10-CM | POA: Diagnosis not present

## 2023-09-26 DIAGNOSIS — Z13220 Encounter for screening for lipoid disorders: Secondary | ICD-10-CM | POA: Diagnosis not present

## 2023-09-26 DIAGNOSIS — K921 Melena: Secondary | ICD-10-CM | POA: Diagnosis not present

## 2023-09-26 DIAGNOSIS — N400 Enlarged prostate without lower urinary tract symptoms: Secondary | ICD-10-CM | POA: Diagnosis not present

## 2023-09-26 DIAGNOSIS — Z0000 Encounter for general adult medical examination without abnormal findings: Secondary | ICD-10-CM | POA: Diagnosis not present

## 2024-03-08 IMAGING — MR MR ABDOMEN WO/W CM
12 of 19 series · 26 of 48 positions shown · IV contrast (19 ml multihance)
Comparison: CT abdomen and pelvis 04/28/2021

CLINICAL DATA: Indeterminate renal lesion

EXAM:
MRI ABDOMEN WITHOUT AND WITH CONTRAST
TECHNIQUE: Multiplanar multisequence MR imaging of the abdomen was performed
both before and after the administration of intravenous contrast.
CONTRAST:  20mL MULTIHANCE GADOBENATE DIMEGLUMINE 529 MG/ML IV SOLN

[Series 3: cor haste · coronal · 5.0mm · 0.74mm/px · 2 of 40 slices shown]
[im 1/40]
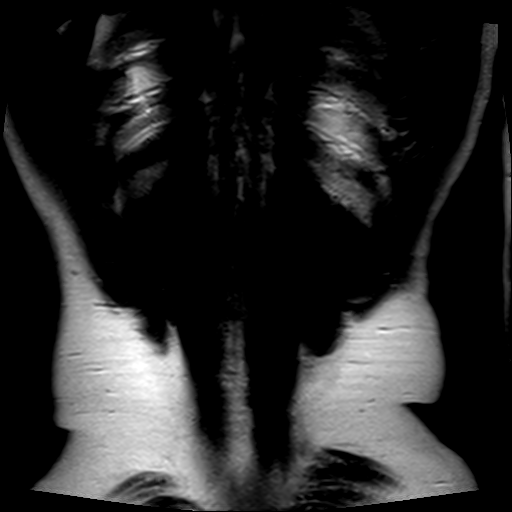
[im 40/40]
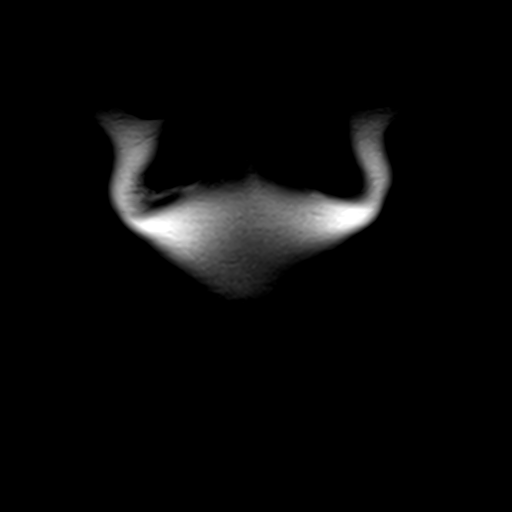

[Series 4: axial haste · axial · 6.0mm · 0.74mm/px · 1 of 36 slices shown]
[im 1/36]
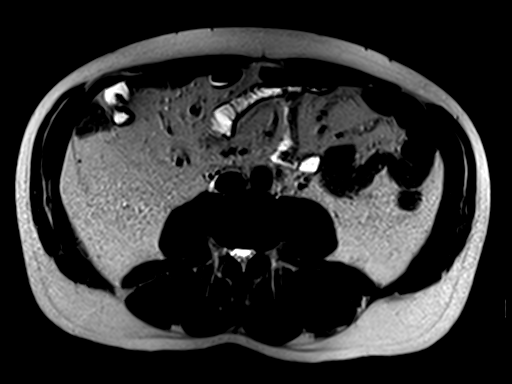

[Series 5: T2 fat-sat · axial · 6.0mm · 1.19mm/px · 1 of 32 slices shown]
[im 1/32]
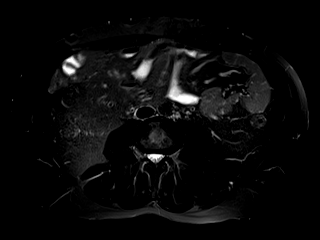

[Series 6: [id number]_trig · axial · 6.0mm · 1.98mm/px · z∈[-92,+131]mm · 3 of 96 slices shown]
[im 1/96]
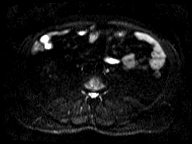
[im 48/96]
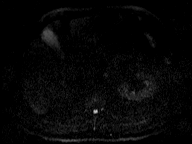
[im 96/96]
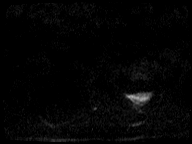

[Series 7: [id number]_trig_adc · axial · 6.0mm · 1.98mm/px · 1 of 32 slices shown]
[im 1/32]
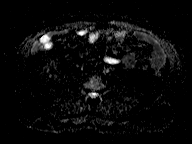

[Series 8: T1 · axial · 6.0mm · 0.74mm/px · z∈[-89,+148]mm · 2 of 74 slices shown]
[im 1/74]
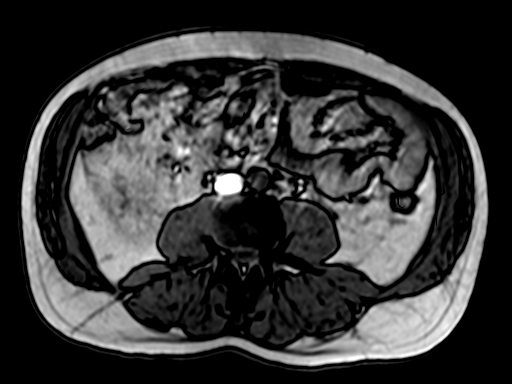
[im 74/74]
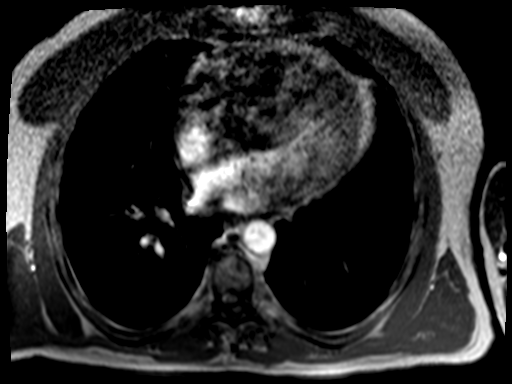

[Series 11: bSSFP · axial · 4.0mm · 0.74mm/px · z∈[-100,+140]mm · 2 of 61 slices shown]
[im 1/61]
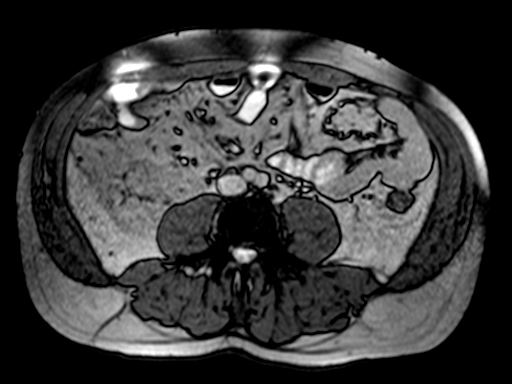
[im 61/61]
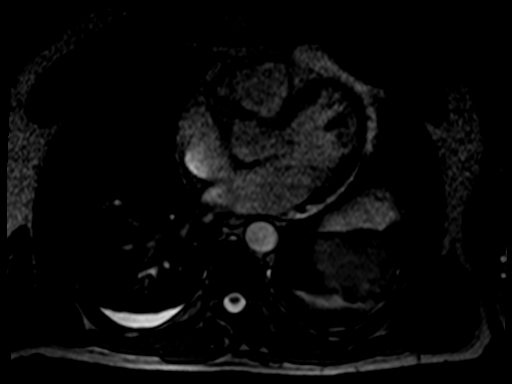

[Series 12: T1 dynamic · axial · non-contrast · 2.5mm · 0.74mm/px · z∈[-92,+146]mm · 3 of 96 slices shown]
[im 1/96]
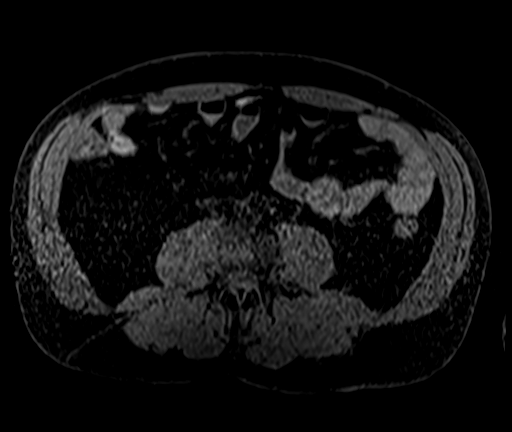
[im 48/96]
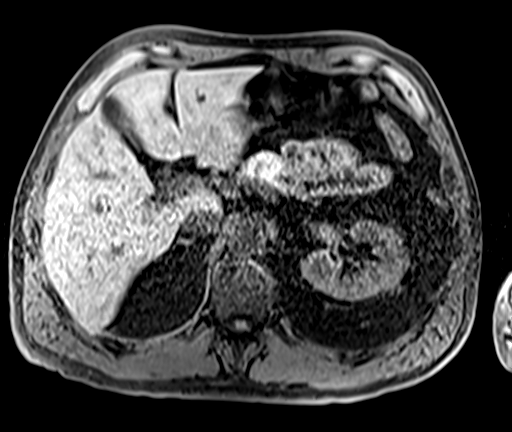
[im 96/96]
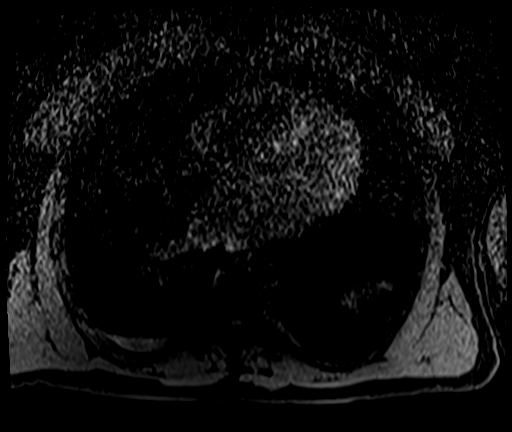

[Series 13: T1 dynamic post-contrast · axial · 2.5mm · 0.74mm/px · z∈[-92,+146]mm · 3 of 96 slices shown (1 of 4)]
[im 1/96]
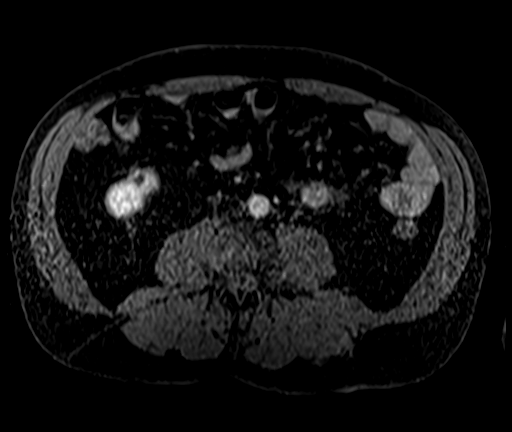
[im 48/96]
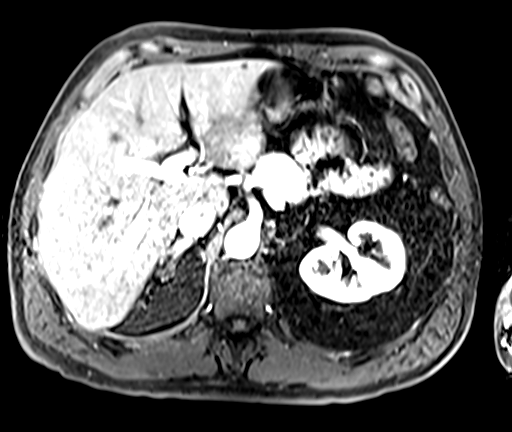
[im 96/96]
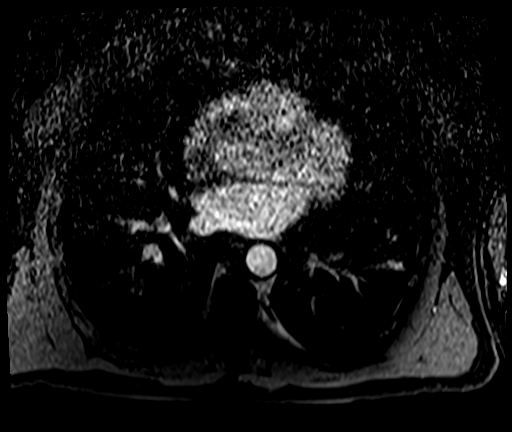

[Series 14: T1 dynamic post-contrast · axial · 2.5mm · 0.74mm/px · z∈[-92,+146]mm · 3 of 96 slices shown (2 of 4)]
[im 1/96]
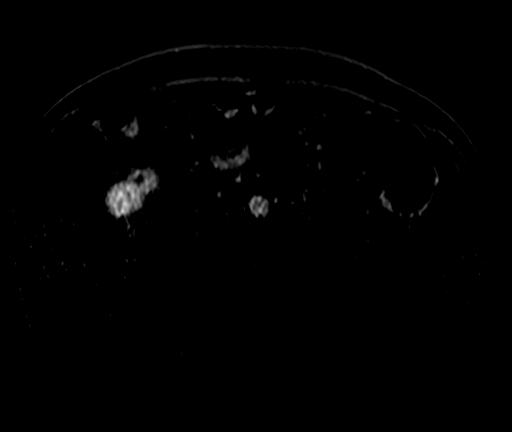
[im 48/96]
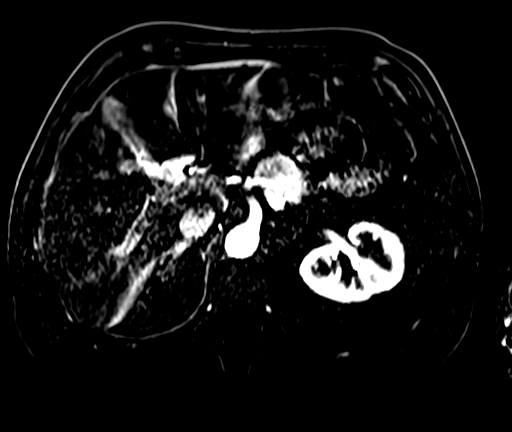
[im 96/96]
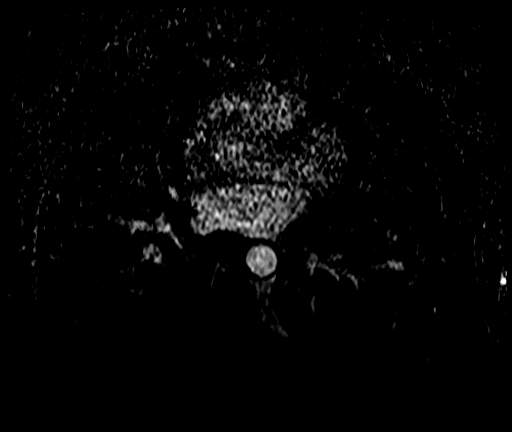

[Series 15: T1 dynamic post-contrast · axial · 2.5mm · 0.74mm/px · z∈[-92,+146]mm · 3 of 96 slices shown (3 of 4)]
[im 1/96]
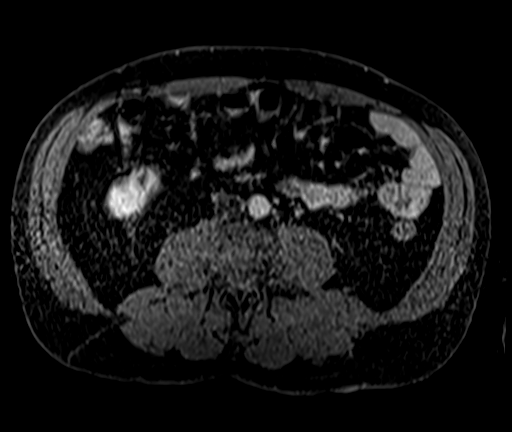
[im 48/96]
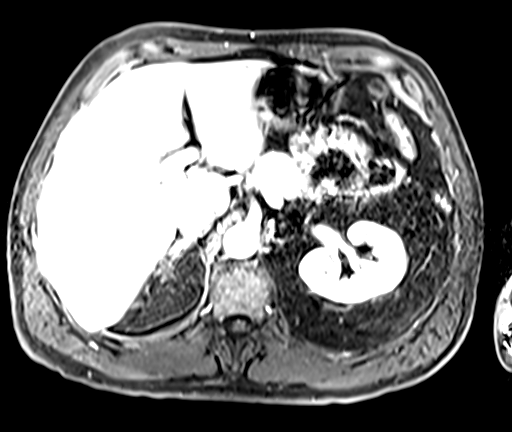
[im 96/96]
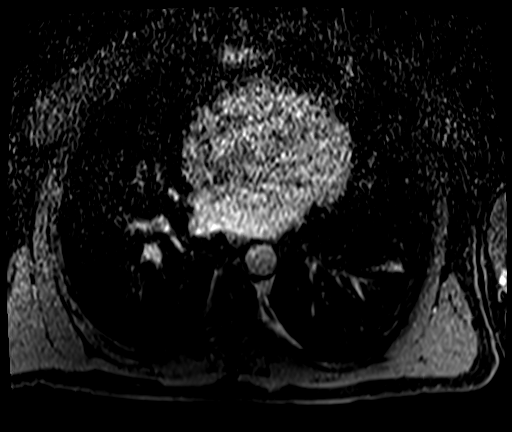

[Series 16: T1 dynamic post-contrast · axial · 2.5mm · 0.74mm/px · z∈[-92,+26]mm · 2 of 96 slices shown (4 of 4)]
[im 1/96]
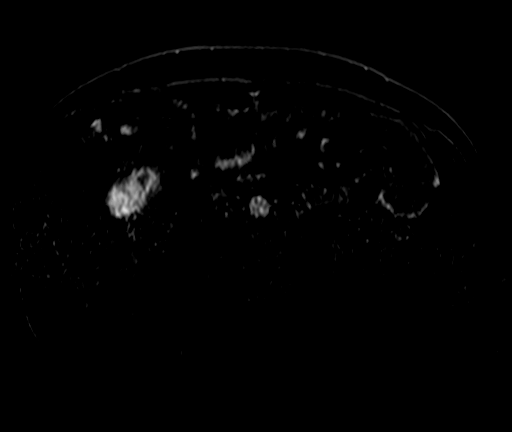
[im 48/96]
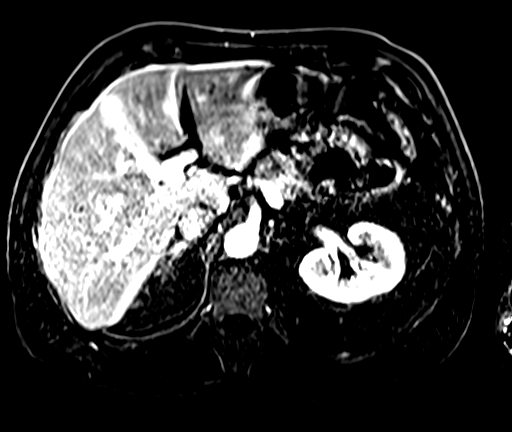

[26 of 48 positions shown; findings below may reference images not displayed]

FINDINGS: Limited study due to motion.

Lower chest: Trace right pleural effusion.

Hepatobiliary: Liver is normal in size and contour with no
suspicious mass identified. A few subcentimeter nonenhancing hepatic
cysts are noted. Gallbladder appears within normal limits. No
biliary ductal dilatation identified.

Pancreas: No mass, inflammatory changes, or other parenchymal
abnormality identified.

Spleen:  Within normal limits in size and appearance.

Adrenals/Urinary Tract: Adrenal glands appear normal. A few simple
appearing cysts identified bilaterally measuring up to 2.9 cm in the
upper left kidney. There is an exophytic 7 mm hyperintense T1,
hypointense T2 signal nonenhancing hemorrhagic/proteinaceous cyst in
the lateral lower left kidney. No enhancing renal mass identified.
No hydronephrosis.

Stomach/Bowel: Visualized portions within the abdomen are
unremarkable.

Vascular/Lymphatic: No pathologically enlarged lymph nodes
identified. No abdominal aortic aneurysm demonstrated.

Other:  No ascites.

Musculoskeletal: No suspicious bone lesions identified.
IMPRESSION: 1. Small exophytic hemorrhagic cyst in the lower left kidney. A few
simple renal cysts bilaterally.
2. Trace right pleural effusion.
